# Patient Record
Sex: Female | Born: 1955 | Race: White | Hispanic: No | Marital: Married | Smoking: Never smoker
Health system: Southern US, Community
[De-identification: ages and names within clinical notes are randomized; demographics above are authoritative.]

## PROBLEM LIST (undated history)

## (undated) DIAGNOSIS — N63 Unspecified lump in unspecified breast: Secondary | ICD-10-CM

## (undated) DIAGNOSIS — I341 Nonrheumatic mitral (valve) prolapse: Secondary | ICD-10-CM

## (undated) DIAGNOSIS — M1990 Unspecified osteoarthritis, unspecified site: Secondary | ICD-10-CM

## (undated) DIAGNOSIS — R87619 Unspecified abnormal cytological findings in specimens from cervix uteri: Secondary | ICD-10-CM

## (undated) DIAGNOSIS — T7840XA Allergy, unspecified, initial encounter: Secondary | ICD-10-CM

## (undated) DIAGNOSIS — R011 Cardiac murmur, unspecified: Secondary | ICD-10-CM

## (undated) DIAGNOSIS — E079 Disorder of thyroid, unspecified: Secondary | ICD-10-CM

## (undated) DIAGNOSIS — F419 Anxiety disorder, unspecified: Secondary | ICD-10-CM

## (undated) DIAGNOSIS — H269 Unspecified cataract: Secondary | ICD-10-CM

## (undated) DIAGNOSIS — F32A Depression, unspecified: Secondary | ICD-10-CM

## (undated) DIAGNOSIS — D649 Anemia, unspecified: Secondary | ICD-10-CM

## (undated) DIAGNOSIS — F329 Major depressive disorder, single episode, unspecified: Secondary | ICD-10-CM

## (undated) HISTORY — DX: Nonrheumatic mitral (valve) prolapse: I34.1

## (undated) HISTORY — DX: Depression, unspecified: F32.A

## (undated) HISTORY — PX: COLONOSCOPY: SHX174

## (undated) HISTORY — PX: COLPOSCOPY: SHX161

## (undated) HISTORY — DX: Disorder of thyroid, unspecified: E07.9

## (undated) HISTORY — DX: Anemia, unspecified: D64.9

## (undated) HISTORY — DX: Cardiac murmur, unspecified: R01.1

## (undated) HISTORY — DX: Unspecified osteoarthritis, unspecified site: M19.90

## (undated) HISTORY — DX: Unspecified cataract: H26.9

## (undated) HISTORY — DX: Unspecified abnormal cytological findings in specimens from cervix uteri: R87.619

## (undated) HISTORY — DX: Anxiety disorder, unspecified: F41.9

## (undated) HISTORY — DX: Allergy, unspecified, initial encounter: T78.40XA

## (undated) HISTORY — DX: Major depressive disorder, single episode, unspecified: F32.9

---

## 1997-08-01 ENCOUNTER — Other Ambulatory Visit: Admission: RE | Admit: 1997-08-01 | Discharge: 1997-08-01 | Payer: Self-pay | Admitting: Internal Medicine

## 1998-08-07 ENCOUNTER — Other Ambulatory Visit: Admission: RE | Admit: 1998-08-07 | Discharge: 1998-08-07 | Payer: Self-pay | Admitting: Internal Medicine

## 1998-10-08 ENCOUNTER — Encounter (INDEPENDENT_AMBULATORY_CARE_PROVIDER_SITE_OTHER): Payer: Self-pay | Admitting: Specialist

## 1998-10-08 ENCOUNTER — Other Ambulatory Visit: Admission: RE | Admit: 1998-10-08 | Discharge: 1998-10-08 | Payer: Self-pay | Admitting: Obstetrics and Gynecology

## 1999-04-02 ENCOUNTER — Other Ambulatory Visit: Admission: RE | Admit: 1999-04-02 | Discharge: 1999-04-02 | Payer: Self-pay | Admitting: Obstetrics and Gynecology

## 1999-08-05 ENCOUNTER — Other Ambulatory Visit: Admission: RE | Admit: 1999-08-05 | Discharge: 1999-08-05 | Payer: Self-pay | Admitting: Obstetrics and Gynecology

## 1999-10-30 ENCOUNTER — Ambulatory Visit (HOSPITAL_COMMUNITY): Admission: RE | Admit: 1999-10-30 | Discharge: 1999-10-30 | Payer: Self-pay | Admitting: Obstetrics and Gynecology

## 1999-10-30 ENCOUNTER — Encounter: Payer: Self-pay | Admitting: Obstetrics and Gynecology

## 1999-12-09 ENCOUNTER — Other Ambulatory Visit: Admission: RE | Admit: 1999-12-09 | Discharge: 1999-12-09 | Payer: Self-pay | Admitting: Obstetrics and Gynecology

## 2001-01-13 ENCOUNTER — Other Ambulatory Visit: Admission: RE | Admit: 2001-01-13 | Discharge: 2001-01-13 | Payer: Self-pay | Admitting: Obstetrics and Gynecology

## 2001-10-17 ENCOUNTER — Ambulatory Visit (HOSPITAL_COMMUNITY): Admission: RE | Admit: 2001-10-17 | Discharge: 2001-10-17 | Payer: Self-pay | Admitting: Obstetrics and Gynecology

## 2001-10-17 ENCOUNTER — Encounter: Payer: Self-pay | Admitting: Obstetrics and Gynecology

## 2002-01-31 ENCOUNTER — Other Ambulatory Visit: Admission: RE | Admit: 2002-01-31 | Discharge: 2002-01-31 | Payer: Self-pay | Admitting: Obstetrics and Gynecology

## 2002-12-20 ENCOUNTER — Ambulatory Visit (HOSPITAL_COMMUNITY): Admission: RE | Admit: 2002-12-20 | Discharge: 2002-12-20 | Payer: Self-pay | Admitting: Obstetrics and Gynecology

## 2003-03-12 ENCOUNTER — Other Ambulatory Visit: Admission: RE | Admit: 2003-03-12 | Discharge: 2003-03-12 | Payer: Self-pay | Admitting: Obstetrics and Gynecology

## 2004-02-08 ENCOUNTER — Ambulatory Visit (HOSPITAL_COMMUNITY): Admission: RE | Admit: 2004-02-08 | Discharge: 2004-02-08 | Payer: Self-pay | Admitting: Obstetrics and Gynecology

## 2004-03-12 ENCOUNTER — Other Ambulatory Visit: Admission: RE | Admit: 2004-03-12 | Discharge: 2004-03-12 | Payer: Self-pay | Admitting: Obstetrics and Gynecology

## 2005-02-25 ENCOUNTER — Ambulatory Visit (HOSPITAL_COMMUNITY): Admission: RE | Admit: 2005-02-25 | Discharge: 2005-02-25 | Payer: Self-pay | Admitting: Obstetrics and Gynecology

## 2005-03-13 ENCOUNTER — Other Ambulatory Visit: Admission: RE | Admit: 2005-03-13 | Discharge: 2005-03-13 | Payer: Self-pay | Admitting: Obstetrics & Gynecology

## 2006-04-14 ENCOUNTER — Other Ambulatory Visit: Admission: RE | Admit: 2006-04-14 | Discharge: 2006-04-14 | Payer: Self-pay | Admitting: Obstetrics & Gynecology

## 2006-12-14 ENCOUNTER — Ambulatory Visit (HOSPITAL_COMMUNITY): Admission: RE | Admit: 2006-12-14 | Discharge: 2006-12-14 | Payer: Self-pay | Admitting: Obstetrics and Gynecology

## 2007-04-15 ENCOUNTER — Other Ambulatory Visit: Admission: RE | Admit: 2007-04-15 | Discharge: 2007-04-15 | Payer: Self-pay | Admitting: Obstetrics & Gynecology

## 2008-04-10 ENCOUNTER — Ambulatory Visit (HOSPITAL_COMMUNITY): Admission: RE | Admit: 2008-04-10 | Discharge: 2008-04-10 | Payer: Self-pay | Admitting: Obstetrics and Gynecology

## 2008-04-19 ENCOUNTER — Other Ambulatory Visit: Admission: RE | Admit: 2008-04-19 | Discharge: 2008-04-19 | Payer: Self-pay | Admitting: Obstetrics & Gynecology

## 2008-06-21 ENCOUNTER — Ambulatory Visit: Payer: Self-pay | Admitting: Internal Medicine

## 2008-07-05 ENCOUNTER — Ambulatory Visit: Payer: Self-pay | Admitting: Internal Medicine

## 2009-04-25 ENCOUNTER — Ambulatory Visit (HOSPITAL_COMMUNITY): Admission: RE | Admit: 2009-04-25 | Discharge: 2009-04-25 | Payer: Self-pay | Admitting: Internal Medicine

## 2009-05-17 ENCOUNTER — Telehealth (INDEPENDENT_AMBULATORY_CARE_PROVIDER_SITE_OTHER): Payer: Self-pay | Admitting: *Deleted

## 2009-08-12 DEATH — deceased

## 2010-02-13 NOTE — Progress Notes (Signed)
  Phone Note Other Incoming   Request: Send information Summary of Call: Received request for records from [facility]. 2 page Colonscopy report faxed.

## 2010-05-09 ENCOUNTER — Other Ambulatory Visit: Payer: Self-pay | Admitting: Obstetrics & Gynecology

## 2010-05-09 DIAGNOSIS — Z1231 Encounter for screening mammogram for malignant neoplasm of breast: Secondary | ICD-10-CM

## 2010-05-12 ENCOUNTER — Ambulatory Visit (HOSPITAL_COMMUNITY)
Admission: RE | Admit: 2010-05-12 | Discharge: 2010-05-12 | Disposition: A | Discharge status: Home / Self Care | Payer: BC Managed Care – PPO | Source: Ambulatory Visit | Attending: Obstetrics & Gynecology | Admitting: Obstetrics & Gynecology

## 2010-05-12 DIAGNOSIS — Z1231 Encounter for screening mammogram for malignant neoplasm of breast: Secondary | ICD-10-CM | POA: Insufficient documentation

## 2011-05-05 ENCOUNTER — Other Ambulatory Visit: Payer: Self-pay | Admitting: Obstetrics & Gynecology

## 2011-05-05 DIAGNOSIS — Z1231 Encounter for screening mammogram for malignant neoplasm of breast: Secondary | ICD-10-CM

## 2011-05-28 ENCOUNTER — Ambulatory Visit (HOSPITAL_COMMUNITY)
Admission: RE | Admit: 2011-05-28 | Discharge: 2011-05-28 | Disposition: A | Discharge status: Home / Self Care | Payer: BC Managed Care – PPO | Source: Ambulatory Visit | Attending: Obstetrics & Gynecology | Admitting: Obstetrics & Gynecology

## 2011-05-28 DIAGNOSIS — Z1231 Encounter for screening mammogram for malignant neoplasm of breast: Secondary | ICD-10-CM | POA: Insufficient documentation

## 2012-05-18 ENCOUNTER — Other Ambulatory Visit: Payer: Self-pay | Admitting: Obstetrics and Gynecology

## 2012-05-18 DIAGNOSIS — Z1231 Encounter for screening mammogram for malignant neoplasm of breast: Secondary | ICD-10-CM

## 2012-05-20 ENCOUNTER — Encounter: Payer: Self-pay | Admitting: Certified Nurse Midwife

## 2012-05-23 ENCOUNTER — Ambulatory Visit: Payer: Self-pay | Admitting: Certified Nurse Midwife

## 2012-05-31 ENCOUNTER — Ambulatory Visit (HOSPITAL_COMMUNITY)
Admission: RE | Admit: 2012-05-31 | Discharge: 2012-05-31 | Disposition: A | Discharge status: Home / Self Care | Payer: BC Managed Care – PPO | Source: Ambulatory Visit | Attending: Obstetrics and Gynecology | Admitting: Obstetrics and Gynecology

## 2012-05-31 DIAGNOSIS — Z1231 Encounter for screening mammogram for malignant neoplasm of breast: Secondary | ICD-10-CM | POA: Insufficient documentation

## 2012-06-02 ENCOUNTER — Encounter: Payer: Self-pay | Admitting: Certified Nurse Midwife

## 2012-06-02 ENCOUNTER — Ambulatory Visit (INDEPENDENT_AMBULATORY_CARE_PROVIDER_SITE_OTHER): Payer: BC Managed Care – PPO | Admitting: Certified Nurse Midwife

## 2012-06-02 VITALS — BP 104/64 | Ht 62.5 in | Wt 132.0 lb

## 2012-06-02 DIAGNOSIS — E039 Hypothyroidism, unspecified: Secondary | ICD-10-CM

## 2012-06-02 DIAGNOSIS — N951 Menopausal and female climacteric states: Secondary | ICD-10-CM

## 2012-06-02 DIAGNOSIS — Z01419 Encounter for gynecological examination (general) (routine) without abnormal findings: Secondary | ICD-10-CM

## 2012-06-02 DIAGNOSIS — Z0000 Encounter for general adult medical examination without abnormal findings: Secondary | ICD-10-CM

## 2012-06-02 LAB — POCT URINALYSIS DIPSTICK
Blood, UA: NEGATIVE
Ketones, UA: NEGATIVE
Urobilinogen, UA: NEGATIVE

## 2012-06-02 LAB — TSH: TSH: 1.663 u[IU]/mL (ref 0.350–4.500)

## 2012-06-02 NOTE — Progress Notes (Signed)
57 y.o. G28P2003 Married Caucasian Fe here for annual exam. Menopausal now, no period in one year.  Denies vaginal bleeding after stopping OCP in 5-13. Occasional hot flashes, no night sweats, desires no HRT.  Saw PCP in fall with labs, all normal. Had mammogram 2 days ago, no results yet.  No health issues today.  Denies signs or symptoms of change in thyroid.  Patient's last menstrual period was 05/18/2011.          Sexually active: yes  The current method of family planning is none.    Exercising: yes  bike & walking Smoker:  no  Health Maintenance: Pap:  05-18-11 neg HPV HR neg MMG:  05/31/12 Colonoscopy:  2010 BMD:   none TDaP:  2007 Labs: Poct urine-neg Self breast exams: occ   reports that she has never smoked. She does not have any smokeless tobacco history on file. She reports that she drinks about 3.0 ounces of alcohol per week. She reports that she does not use illicit drugs.  Past Medical History  Diagnosis Date  . MVP (mitral valve prolapse)   . Thyroid disease     hypothyroidism  . Depression     Past Surgical History  Procedure Laterality Date  . Colposcopy      [name]  . Cesarean section      Current Outpatient Prescriptions  Medication Sig Dispense Refill  . Calcium Carbonate Antacid (TUMS PO) Take by mouth daily.      . fluvoxaMINE (LUVOX) 100 MG tablet Take 100 mg by mouth. Take 1 1/2 daily      . levothyroxine (SYNTHROID, LEVOTHROID) 50 MCG tablet Take 50 mcg by mouth daily before breakfast.       No current facility-administered medications for this visit.    Family History  Problem Relation Age of Onset  . Hypertension Mother   . Breast cancer Mother   . Diabetes Father     ROS:  Pertinent items are noted in HPI.  Otherwise, a comprehensive ROS was negative.  Exam:   BP 104/64  Ht 5' 2.5" (1.588 m)  Wt 132 lb (59.875 kg)  BMI 23.74 kg/m2  LMP 05/18/2011 Height: 5' 2.5" (158.8 cm)  Ht Readings from Last 3 Encounters:  06/02/12 5' 2.5" (1.588  m)    General appearance: alert, cooperative and appears stated age Head: Normocephalic, without obvious abnormality, atraumatic Neck: no adenopathy, supple, symmetrical, trachea midline and thyroid normal to inspection and palpation Lungs: clear to auscultation bilaterally Breasts: normal appearance, no masses or tenderness, No nipple retraction or dimpling, No nipple discharge or bleeding, No axillary or supraclavicular adenopathy Heart: regular rate and rhythm Abdomen: soft, non-tender; no masses,  no organomegaly Extremities: extremities normal, atraumatic, no cyanosis or edema Skin: Skin color, texture, turgor normal. No rashes or lesions Lymph nodes: Cervical, supraclavicular, and axillary nodes normal. No abnormal inguinal nodes palpated Neurologic: Grossly normal   Pelvic: External genitalia:  no lesions              Urethra:  normal appearing urethra with no masses, tenderness or lesions              Bartholin's and Skene's: normal                 Vagina: normal appearing vagina with normal color and discharge, no lesions              Cervix: normal, non tender  Pap taken: no Bimanual Exam:  Uterus:  normal size, contour, position, consistency, mobility, non-tender and anteflexed              Adnexa: normal adnexa and no mass, fullness, tenderness               Rectovaginal: Confirms               Anus:  normal sphincter tone, no lesions  A:  Well Woman with normal exam  Menopausal, no menses in a year  Hypothyroid stable medication  P:   Health and wellness reviewed pertinent to exam  Discussed importance of evaluation if vaginal bleeding occurs now that menopausal  Has Rx will refill once TSH results in  Lab:TSH,FSH  Pap smear as per guidelines   Mammogram yearly pap smear not taken today counseled on breast self exam, menopause, adequate intake of calcium and vitamin D, diet and exercise  return annually or prn  An After Visit Summary was printed and  given to the patient.  Reviewed, TL

## 2012-06-24 ENCOUNTER — Telehealth: Payer: Self-pay

## 2012-06-24 ENCOUNTER — Other Ambulatory Visit: Payer: Self-pay | Admitting: Certified Nurse Midwife

## 2012-06-24 NOTE — Telephone Encounter (Signed)
Message copied by Eliezer Bottom on Fri Jun 24, 2012  2:00 PM ------      Message from: Verner Chol      Created: Fri Jun 24, 2012  1:14 PM       Notify thyroid stable with current dose of Synthroid.  Refill authorized.      FSH showing low menopausal range, needs to notify if any vaginal bleeding      Has she scheduled PUS yet waiting on result to determine if Provera needs to be given ------

## 2012-06-24 NOTE — Telephone Encounter (Signed)
Pharmacy is requesting refill. At aex it says you were waiting on labs. Labs came in but it doesn't look like you saw them or addressed them. Please advise & refill med is appropriate

## 2012-06-24 NOTE — Telephone Encounter (Signed)
Patient notified by jasmine

## 2012-06-24 NOTE — Telephone Encounter (Signed)
rx approved by DL

## 2012-06-24 NOTE — Progress Notes (Signed)
Routed to DL 

## 2012-06-24 NOTE — Telephone Encounter (Signed)
Left message for callback to give lab results & let pt know med was sent to pharmacy

## 2012-06-27 ENCOUNTER — Telehealth: Payer: Self-pay

## 2012-06-27 NOTE — Telephone Encounter (Signed)
Left message for call back.

## 2012-06-27 NOTE — Telephone Encounter (Signed)
lmtcb

## 2012-06-27 NOTE — Telephone Encounter (Signed)
Patient returned your call.

## 2012-06-27 NOTE — Telephone Encounter (Signed)
Message copied by Eliezer Bottom on Mon Jun 27, 2012 10:19 AM ------      Message from: Verner Chol      Created: Mon Jun 27, 2012  7:48 AM       PUS note was entered in error.      Patient should notify if any vaginal bleeding now that she is menopausal. ------

## 2012-06-28 NOTE — Telephone Encounter (Signed)
Patient notified

## 2012-11-17 ENCOUNTER — Other Ambulatory Visit: Payer: Self-pay

## 2012-12-17 ENCOUNTER — Other Ambulatory Visit: Payer: Self-pay | Admitting: Certified Nurse Midwife

## 2012-12-19 NOTE — Telephone Encounter (Signed)
Per AEX note and lab results ok to stay on this dose. Has AEX scheduled for 5/15//kn

## 2013-02-01 ENCOUNTER — Encounter: Payer: Self-pay | Admitting: Certified Nurse Midwife

## 2013-06-01 ENCOUNTER — Other Ambulatory Visit (HOSPITAL_COMMUNITY): Payer: Self-pay | Admitting: Obstetrics and Gynecology

## 2013-06-01 ENCOUNTER — Other Ambulatory Visit: Payer: Self-pay | Admitting: Certified Nurse Midwife

## 2013-06-01 DIAGNOSIS — Z1231 Encounter for screening mammogram for malignant neoplasm of breast: Secondary | ICD-10-CM

## 2013-06-06 ENCOUNTER — Ambulatory Visit: Payer: BC Managed Care – PPO | Admitting: Certified Nurse Midwife

## 2013-06-12 ENCOUNTER — Ambulatory Visit (HOSPITAL_COMMUNITY)
Admission: RE | Admit: 2013-06-12 | Discharge: 2013-06-12 | Disposition: A | Discharge status: Home / Self Care | Payer: BC Managed Care – PPO | Source: Ambulatory Visit | Attending: Obstetrics and Gynecology | Admitting: Obstetrics and Gynecology

## 2013-06-12 DIAGNOSIS — Z1231 Encounter for screening mammogram for malignant neoplasm of breast: Secondary | ICD-10-CM | POA: Insufficient documentation

## 2013-06-20 ENCOUNTER — Other Ambulatory Visit: Payer: Self-pay | Admitting: Certified Nurse Midwife

## 2013-06-20 NOTE — Telephone Encounter (Signed)
Last AEX and TSH check 06/02/12 Last refill 12/17/12 #30/5 refills Next appt 06/23/13  Rx sent for 1 month until appt.

## 2013-06-23 ENCOUNTER — Ambulatory Visit (INDEPENDENT_AMBULATORY_CARE_PROVIDER_SITE_OTHER): Payer: BC Managed Care – PPO | Admitting: Certified Nurse Midwife

## 2013-06-23 ENCOUNTER — Encounter: Payer: Self-pay | Admitting: Certified Nurse Midwife

## 2013-06-23 VITALS — BP 110/72 | HR 68 | Resp 16 | Ht 62.25 in | Wt 126.0 lb

## 2013-06-23 DIAGNOSIS — Z0000 Encounter for general adult medical examination without abnormal findings: Secondary | ICD-10-CM

## 2013-06-23 DIAGNOSIS — Z01419 Encounter for gynecological examination (general) (routine) without abnormal findings: Secondary | ICD-10-CM

## 2013-06-23 DIAGNOSIS — Z124 Encounter for screening for malignant neoplasm of cervix: Secondary | ICD-10-CM

## 2013-06-23 DIAGNOSIS — E039 Hypothyroidism, unspecified: Secondary | ICD-10-CM

## 2013-06-23 LAB — POCT URINALYSIS DIPSTICK
Bilirubin, UA: NEGATIVE
Blood, UA: NEGATIVE
Glucose, UA: NEGATIVE
Ketones, UA: NEGATIVE
Leukocytes, UA: NEGATIVE
Nitrite, UA: NEGATIVE
Protein, UA: NEGATIVE
Urobilinogen, UA: NEGATIVE
pH, UA: 5

## 2013-06-23 LAB — TSH: TSH: 1.239 u[IU]/mL (ref 0.350–4.500)

## 2013-06-23 NOTE — Progress Notes (Signed)
58 y.o. G2P2003 Married Caucasian Fe here for annual exam. Menopausal no vaginal bleeding or vaginal dryness. No period or bleeding since last aex exam and Provera use. "Good year" daughter getting married in 5/16 !  Sees PCP yearly with labs. Synthroid working well no issues. No health issues today.  Patient's last menstrual period was 06/12/2012.          Sexually active: yes  The current method of family planning is none.    Exercising: yes  bike & walk Smoker:  no  Health Maintenance: Pap: 05-18-11 neg HPV HR neg MMG: 06-12-13 normal Colonoscopy: 2010 polyp removed, due 2015, patient aware and is scheduling BMD:   none TDaP:  2007 Labs: Poct urine-neg Self breast exam: done occ   reports that she has never smoked. She does not have any smokeless tobacco history on file. She reports that she drinks about 3 - 4 ounces of alcohol per week. She reports that she does not use illicit drugs.  Past Medical History  Diagnosis Date  . MVP (mitral valve prolapse)   . Thyroid disease     hypothyroidism  . Depression     Past Surgical History  Procedure Laterality Date  . Colposcopy      [name]  . Cesarean section      Current Outpatient Prescriptions  Medication Sig Dispense Refill  . Calcium Carbonate Antacid (TUMS PO) Take by mouth daily.      . fluvoxaMINE (LUVOX) 100 MG tablet Take 100 mg by mouth. Take 1 1/2 daily      . SYNTHROID 50 MCG tablet take 1 tablet by mouth once daily  30 tablet  0   No current facility-administered medications for this visit.    Family History  Problem Relation Age of Onset  . Hypertension Mother   . Breast cancer Mother   . Diabetes Father     ROS:  Pertinent items are noted in HPI.  Otherwise, a comprehensive ROS was negative.  Exam:   BP 110/72  Pulse 68  Resp 16  Ht 5' 2.25" (1.581 m)  Wt 126 lb (57.153 kg)  BMI 22.87 kg/m2  LMP 06/12/2012 Height: 5' 2.25" (158.1 cm)  Ht Readings from Last 3 Encounters:  06/23/13 5' 2.25" (1.581 m)   06/02/12 5' 2.5" (1.588 m)    General appearance: alert, cooperative and appears stated age Head: Normocephalic, without obvious abnormality, atraumatic Neck: no adenopathy, supple, symmetrical, trachea midline and thyroid normal to inspection and palpation and non-palpable Lungs: clear to auscultation bilaterally Breasts: normal appearance, no masses or tenderness, No nipple retraction or dimpling, No nipple discharge or bleeding, No axillary or supraclavicular adenopathy Heart: regular rate and rhythm Abdomen: soft, non-tender; no masses,  no organomegaly Extremities: extremities normal, atraumatic, no cyanosis or edema Skin: Skin color, texture, turgor normal. No rashes or lesions Lymph nodes: Cervical, supraclavicular, and axillary nodes normal. No abnormal inguinal nodes palpated Neurologic: Grossly normal   Pelvic: External genitalia:  no lesions              Urethra:  normal appearing urethra with no masses, tenderness or lesions              Bartholin's and Skene's: normal                 Vagina: normal appearing vagina with normal color and discharge, no lesions              Cervix: normal, non tender  Pap taken: yes Bimanual Exam:  Uterus:  normal size, contour, position, consistency, mobility, non-tender and anteverted              Adnexa: normal adnexa and no mass, fullness, tenderness               Rectovaginal: Confirms               Anus:  normal sphincter tone, no lesions  A:  Well Woman with normal exam  Menopausal no HRT  Hypothyroid medication stable  P:   Reviewed health and wellness pertinent to exam  Aware of need to advise if vaginal bleeding  Lab:TSH will refill Rx once level in, patient has 30 days left.  Pap smear taken today with HPV reflex  counseled on breast self exam, mammography screening, menopause, adequate intake of calcium and vitamin D, diet and exercise  return annually or prn  An After Visit Summary was printed and given to  the patient.

## 2013-06-23 NOTE — Patient Instructions (Signed)

## 2013-06-24 NOTE — Progress Notes (Signed)
Reviewed personally.  M. Suzanne Islam Villescas, MD.  

## 2013-06-26 MED ORDER — LEVOTHYROXINE SODIUM 50 MCG PO TABS: 50.0000 ug | ORAL_TABLET | Freq: Every day | ORAL | Status: DC

## 2013-06-26 NOTE — Addendum Note (Signed)
Addended by: Regina Eck on: 06/26/2013 08:23 AM   Modules accepted: Orders

## 2013-06-27 LAB — IPS PAP TEST WITH REFLEX TO HPV

## 2013-11-13 ENCOUNTER — Encounter: Payer: Self-pay | Admitting: Certified Nurse Midwife

## 2013-12-21 ENCOUNTER — Encounter: Payer: Self-pay | Admitting: Internal Medicine

## 2014-02-20 ENCOUNTER — Ambulatory Visit (AMBULATORY_SURGERY_CENTER): Payer: Self-pay

## 2014-02-20 VITALS — Ht 63.0 in | Wt 124.0 lb

## 2014-02-20 DIAGNOSIS — Z860100 Personal history of colon polyps, unspecified: Secondary | ICD-10-CM

## 2014-02-20 MED ORDER — MOVIPREP 100 G PO SOLR: 1.0000 | Freq: Once | ORAL | Status: DC

## 2014-02-20 NOTE — Progress Notes (Signed)
No allergies to eggs or soy No home oxygen No diet/weight loss meds No past problems with anesthesia  Has email  Emmi instructions given for colonoscopy 

## 2014-03-06 ENCOUNTER — Encounter: Payer: Self-pay | Admitting: Internal Medicine

## 2014-03-06 ENCOUNTER — Ambulatory Visit (AMBULATORY_SURGERY_CENTER): Payer: BLUE CROSS/BLUE SHIELD | Admitting: Internal Medicine

## 2014-03-06 VITALS — BP 122/77 | HR 79 | Temp 98.6°F | Resp 27 | Ht 63.0 in | Wt 124.0 lb

## 2014-03-06 DIAGNOSIS — D122 Benign neoplasm of ascending colon: Secondary | ICD-10-CM

## 2014-03-06 DIAGNOSIS — Z86010 Personal history of colonic polyps: Secondary | ICD-10-CM

## 2014-03-06 MED ORDER — SODIUM CHLORIDE 0.9 % IV SOLN: 500.0000 mL | INTRAVENOUS | Status: DC

## 2014-03-06 NOTE — Patient Instructions (Signed)
YOU HAD AN ENDOSCOPIC PROCEDURE TODAY AT THE [facility]: Refer to the procedure report that was given to you for any specific questions about what was found during the examination.  If the procedure report does not answer your questions, please call your gastroenterologist to clarify.  If you requested that your care partner not be given the details of your procedure findings, then the procedure report has been included in a sealed envelope for you to review at your convenience later.  YOU SHOULD EXPECT: Some feelings of bloating in the abdomen. Passage of more gas than usual.  Walking can help get rid of the air that was put into your GI tract during the procedure and reduce the bloating. If you had a lower endoscopy (such as a colonoscopy or flexible sigmoidoscopy) you may notice spotting of blood in your stool or on the toilet paper. If you underwent a bowel prep for your procedure, then you may not have a normal bowel movement for a few days.  DIET: Your first meal following the procedure should be a light meal and then it is ok to progress to your normal diet.  A half-sandwich or bowl of soup is an example of a good first meal.  Heavy or fried foods are harder to digest and may make you feel nauseous or bloated.  Likewise meals heavy in dairy and vegetables can cause extra gas to form and this can also increase the bloating.  Drink plenty of fluids but you should avoid alcoholic beverages for 24 hours.  ACTIVITY: Your care partner should take you home directly after the procedure.  You should plan to take it easy, moving slowly for the rest of the day.  You can resume normal activity the day after the procedure however you should NOT DRIVE or use heavy machinery for 24 hours (because of the sedation medicines used during the test).    SYMPTOMS TO REPORT IMMEDIATELY: A gastroenterologist can be reached at any hour.  During normal business hours, 8:30 AM to 5:00 PM Monday through Friday,  call [phone].  After hours and on weekends, please call the GI answering service at [phone] who will take a message and have the physician on call contact you.   Following lower endoscopy (colonoscopy or flexible sigmoidoscopy):  Excessive amounts of blood in the stool  Significant tenderness or worsening of abdominal pains  Swelling of the abdomen that is new, acute  Fever of 100F or higher  FOLLOW UP: If any biopsies were taken you will be contacted by phone or by letter within the next 1-3 weeks.  Call your gastroenterologist if you have not heard about the biopsies in 3 weeks.  Our staff will call the home number listed on your records the next business day following your procedure to check on you and address any questions or concerns that you may have at that time regarding the information given to you following your procedure. This is a courtesy call and so if there is no answer at the home number and we have not heard from you through the emergency physician on call, we will assume that you have returned to your regular daily activities without incident.  SIGNATURES/CONFIDENTIALITY: You and/or your care partner have signed paperwork which will be entered into your electronic medical record.  These signatures attest to the fact that that the information above on your After Visit Summary has been reviewed and is understood.  Full responsibility of the confidentiality of this   discharge information lies with you and/or your care-partner.  Continue your normal medications  Please read over handouts about polyps and high fiber diets  You might note some irritation in your nose or some drainage.  This may cause feelings of congestion.  This is from the oxygen, which can be irritating.  There is no need for concern, this should clear up in a day or so.

## 2014-03-06 NOTE — Op Note (Signed)
[facility]  [address] [place], [address]   COLONOSCOPY PROCEDURE REPORT  PATIENT: Nicole Singleton, Nicole Singleton  MR#: [id number] BIRTHDATE: 1955/05/10 , 68  yrs. old GENDER: female ENDOSCOPIST: Lafayette Dragon, MD REFERRED BY:R.  Marcellus Scott, M.D., Dr Edwinna Areola PROCEDURE DATE:  03/06/2014 PROCEDURE:   Colonoscopy with snare polypectomy First Screening Colonoscopy - Avg.  risk and is 50 yrs.  old or older - No.  Prior Negative Screening - Now for repeat screening. N/A  History of Adenoma - Now for follow-up colonoscopy & has been > or = to 3 yrs.  N/A  Polyps Removed Today? Yes. ASA CLASS:   Class I INDICATIONS:prior colonoscopy in June 2010.  Pedunculated sigmoid polyp removed but not retrieved. MEDICATIONS: Monitored anesthesia care and Propofol 200 mg IV  DESCRIPTION OF PROCEDURE:   After the risks benefits and alternatives of the procedure were thoroughly explained, informed consent was obtained.  The digital rectal exam revealed no abnormalities of the rectum.   The LB [id number]  endoscope was introduced through the anus and advanced to the cecum, which was identified by both the appendix and ileocecal valve. No adverse events experienced.   The quality of the prep was excellent, using MoviPrep  The instrument was then slowly withdrawn as the colon was fully examined.      COLON FINDINGS: A polypoid shaped sessile polyp measuring 7 mm in size was found in the ascending colon.  A polypectomy was performed with a cold snare.  The resection was complete, the polyp tissue was completely retrieved and sent to histology.  Retroflexed views revealed no abnormalities. The time to cecum=2 minutes 38 seconds. Withdrawal time=6 minutes 00 seconds.  The scope was withdrawn and the procedure completed. COMPLICATIONS: There were no immediate complications.  ENDOSCOPIC IMPRESSION: Sessile polyp was found in the ascending colon; polypectomy was performed with a  cold snare  RECOMMENDATIONS: 1.  Await pathology results 2.  High-fiber diet Recall colonoscopy pending path report  eSigned:  Lafayette Dragon, MD 03/06/2014 10:56 AM   cc:

## 2014-03-06 NOTE — Progress Notes (Signed)
Called to room to assist during endoscopic procedure.  Patient ID and intended procedure confirmed with present staff. Received instructions for my participation in the procedure from the performing physician.  

## 2014-03-06 NOTE — Progress Notes (Signed)
Pt stable to RR 

## 2014-03-07 ENCOUNTER — Telehealth: Payer: Self-pay

## 2014-03-07 NOTE — Telephone Encounter (Signed)
  Follow up Call-  Call back number 03/06/2014  Post procedure Call Back phone  # [phone]  Permission to leave phone message Yes     Patient questions:  Do you have a fever, pain , or abdominal swelling? No. Pain Score  0 *  Have you tolerated food without any problems? Yes.    Have you been able to return to your normal activities? Yes.    Do you have any questions about your discharge instructions: Diet   No. Medications  No. Follow up visit  No.  Do you have questions or concerns about your Care? No.  Actions: * If pain score is 4 or above: No action needed, pain <4.

## 2014-03-13 ENCOUNTER — Encounter: Payer: Self-pay | Admitting: Internal Medicine

## 2014-05-24 ENCOUNTER — Other Ambulatory Visit: Payer: Self-pay | Admitting: Certified Nurse Midwife

## 2014-05-24 DIAGNOSIS — Z1231 Encounter for screening mammogram for malignant neoplasm of breast: Secondary | ICD-10-CM

## 2014-06-14 ENCOUNTER — Other Ambulatory Visit: Payer: Self-pay | Admitting: Certified Nurse Midwife

## 2014-06-14 ENCOUNTER — Ambulatory Visit (HOSPITAL_COMMUNITY)
Admission: RE | Admit: 2014-06-14 | Discharge: 2014-06-14 | Disposition: A | Discharge status: Home / Self Care | Payer: BLUE CROSS/BLUE SHIELD | Source: Ambulatory Visit | Attending: Certified Nurse Midwife | Admitting: Certified Nurse Midwife

## 2014-06-14 DIAGNOSIS — Z1231 Encounter for screening mammogram for malignant neoplasm of breast: Secondary | ICD-10-CM | POA: Diagnosis not present

## 2014-06-28 ENCOUNTER — Ambulatory Visit (INDEPENDENT_AMBULATORY_CARE_PROVIDER_SITE_OTHER): Payer: BLUE CROSS/BLUE SHIELD | Admitting: Certified Nurse Midwife

## 2014-06-28 ENCOUNTER — Encounter: Payer: Self-pay | Admitting: Certified Nurse Midwife

## 2014-06-28 VITALS — BP 110/78 | HR 80 | Resp 14 | Ht 62.0 in | Wt 125.0 lb

## 2014-06-28 DIAGNOSIS — E039 Hypothyroidism, unspecified: Secondary | ICD-10-CM

## 2014-06-28 DIAGNOSIS — Z01419 Encounter for gynecological examination (general) (routine) without abnormal findings: Secondary | ICD-10-CM

## 2014-06-28 DIAGNOSIS — Z0000 Encounter for general adult medical examination without abnormal findings: Secondary | ICD-10-CM | POA: Diagnosis not present

## 2014-06-28 LAB — POCT URINALYSIS DIPSTICK
Bilirubin, UA: NEGATIVE
Glucose, UA: NEGATIVE
Ketones, UA: NEGATIVE
Leukocytes, UA: NEGATIVE
Nitrite, UA: NEGATIVE
PROTEIN UA: NEGATIVE
RBC UA: NEGATIVE
SPEC GRAV UA: 1.015
Urobilinogen, UA: NEGATIVE
pH, UA: 6.5

## 2014-06-28 LAB — HEMOGLOBIN, FINGERSTICK: Hemoglobin, fingerstick: 12.9 g/dL (ref 12.0–16.0)

## 2014-06-28 LAB — TSH: TSH: 1.095 u[IU]/mL (ref 0.350–4.500)

## 2014-06-28 NOTE — Progress Notes (Signed)
Patient ID: Nicole Singleton, female   DOB: 13-Mar-1955, 59 y.o.   MRN: [id number] 59 y.o. [id number] MarriedCaucasianF here for annual exam. Menopausal no HRT. Denies vaginal bleeding or vaginal dryness. Thyroid medication working well, no issues. Sees PCP for labs/ aex every two years. All normal in 2015. Good year, no issues today.  Patient's last menstrual period was 06/12/2012.          Sexually active: Yes.    The current method of family planning is post menopausal status.    Exercising: Yes.    bike Smoker:  no  Health Maintenance: Pap:  06-23-13 WNL History of abnormal Pap:  Yes many years ago- cryotherapy  MMG:  06-14-14 WNL Colonoscopy:  03-06-14 polyps repeat in 5 years BMD:   Never TDaP:  12-13-2005 Screening Labs: drawn today  Hb today: 12.9, Urine today: WNL    reports that she has never smoked. She has never used smokeless tobacco. She reports that she drinks about 3.6 - 4.8 oz of alcohol per week. She reports that she does not use illicit drugs.  Past Medical History  Diagnosis Date  . MVP (mitral valve prolapse)   . Thyroid disease     hypothyroidism  . Depression     Past Surgical History  Procedure Laterality Date  . Colposcopy      [name]  . Cesarean section      Current Outpatient Prescriptions  Medication Sig Dispense Refill  . Biotin 5000 MCG CAPS Take by mouth.    . Calcium Carbonate Antacid (TUMS PO) Take by mouth daily.    . fluvoxaMINE (LUVOX) 50 MG tablet Take 150 mg by mouth at bedtime.     Marland Kitchen levothyroxine (SYNTHROID, LEVOTHROID) 50 MCG tablet Take 1 tablet (50 mcg total) by mouth daily. 30 tablet 12  . Multiple Vitamin (MULTIVITAMIN) tablet Take 1 tablet by mouth daily.     No current facility-administered medications for this visit.    Family History  Problem Relation Age of Onset  . Hypertension Mother   . Breast cancer Mother   . Diabetes Father   . Colon cancer Neg Hx   . Stomach cancer Neg Hx     ROS:  Pertinent items are noted in HPI.   Otherwise, a comprehensive ROS was negative.  Exam:   BP 110/78 mmHg  Pulse 80  Resp 14  Ht 5\' 2"  (1.575 m)  Wt 125 lb (56.7 kg)  BMI 22.86 kg/m2  LMP 06/12/2012  Weight change: @WEIGHTCHANGE @ Height:   Height: 5\' 2"  (157.5 cm)  Ht Readings from Last 3 Encounters:  06/28/14 5\' 2"  (1.575 m)  03/06/14 5\' 3"  (1.6 m)  02/20/14 5\' 3"  (1.6 m)    General appearance: alert, cooperative and appears stated age Head: Normocephalic, without obvious abnormality, atraumatic Neck: no adenopathy, supple, symmetrical, trachea midline and thyroid normal to inspection and palpation Lungs: clear to auscultation bilaterally Breasts: normal appearance, no masses or tenderness, No nipple retraction or dimpling, No nipple discharge or bleeding, No axillary or supraclavicular adenopathy Heart: regular rate and rhythm Abdomen: soft, non-tender; bowel sounds normal; no masses,  no organomegaly Extremities: extremities normal, atraumatic, no cyanosis or edema Skin: Skin color, texture, turgor normal. No rashes or lesions Lymph nodes: Cervical, supraclavicular, and axillary nodes normal. No abnormal inguinal nodes palpated Neurologic: Grossly normal   Pelvic: External genitalia:  no lesions              Urethra:  normal appearing urethra with no masses, tenderness  or lesions              Bartholins and Skenes: normal                 Vagina: normal appearing vagina with normal color and discharge, no lesions              Cervix: normal, non tender, no lesions              Pap taken: No. Bimanual Exam:  Uterus:  normal size, contour, position, consistency, mobility, non-tender and anteverted              Adnexa: normal adnexa and no mass, fullness, tenderness               Rectovaginal: Confirms               Anus:  normal sphincter tone, no lesions  Chaperone was present for exam.  A:  Well Woman with normal exam  Menopausal no HRT  Hypothyroid with stable medication TSH today  P:   Reviewed exam  and pertinent information regarding exam.   Aware of need to advise if vaginal bleeding.  Patient has enough Synthroid until labs are in, will need refill if stable.  counseled on breast self exam, mammography screening, adequate intake of calcium and vitamin D, diet and exercise return annually or prn

## 2014-06-28 NOTE — Patient Instructions (Signed)

## 2014-06-29 LAB — VITAMIN D 25 HYDROXY (VIT D DEFICIENCY, FRACTURES): Vit D, 25-Hydroxy: 29 ng/mL — ABNORMAL LOW (ref 30–100)

## 2014-06-29 NOTE — Progress Notes (Signed)
Reviewed personally.  M. Suzanne Kashus Karlen, MD.  

## 2014-07-07 ENCOUNTER — Other Ambulatory Visit: Payer: Self-pay | Admitting: Certified Nurse Midwife

## 2014-07-09 NOTE — Telephone Encounter (Signed)
Medication refill request: Synthroid  Last AEX:  06-28-14 Next AEX: 07-03-15 Last MMG (if hormonal medication request): 06-14-14 WNL  Refill authorized: please advise

## 2014-11-27 ENCOUNTER — Encounter: Payer: Self-pay | Admitting: Internal Medicine

## 2015-06-25 ENCOUNTER — Other Ambulatory Visit: Payer: Self-pay | Admitting: Certified Nurse Midwife

## 2015-06-25 DIAGNOSIS — Z1231 Encounter for screening mammogram for malignant neoplasm of breast: Secondary | ICD-10-CM

## 2015-07-02 ENCOUNTER — Ambulatory Visit
Admission: RE | Admit: 2015-07-02 | Discharge: 2015-07-02 | Disposition: A | Discharge status: Home / Self Care | Payer: BLUE CROSS/BLUE SHIELD | Source: Ambulatory Visit | Attending: Certified Nurse Midwife | Admitting: Certified Nurse Midwife

## 2015-07-02 DIAGNOSIS — Z1231 Encounter for screening mammogram for malignant neoplasm of breast: Secondary | ICD-10-CM

## 2015-07-03 ENCOUNTER — Encounter: Payer: Self-pay | Admitting: Certified Nurse Midwife

## 2015-07-03 ENCOUNTER — Ambulatory Visit (INDEPENDENT_AMBULATORY_CARE_PROVIDER_SITE_OTHER): Payer: BLUE CROSS/BLUE SHIELD | Admitting: Certified Nurse Midwife

## 2015-07-03 VITALS — BP 120/72 | HR 72 | Resp 16 | Ht 62.25 in | Wt 126.0 lb

## 2015-07-03 DIAGNOSIS — Z01419 Encounter for gynecological examination (general) (routine) without abnormal findings: Secondary | ICD-10-CM | POA: Diagnosis not present

## 2015-07-03 DIAGNOSIS — Z124 Encounter for screening for malignant neoplasm of cervix: Secondary | ICD-10-CM | POA: Diagnosis not present

## 2015-07-03 DIAGNOSIS — Z0000 Encounter for general adult medical examination without abnormal findings: Secondary | ICD-10-CM

## 2015-07-03 DIAGNOSIS — Z1151 Encounter for screening for human papillomavirus (HPV): Secondary | ICD-10-CM | POA: Diagnosis not present

## 2015-07-03 DIAGNOSIS — N898 Other specified noninflammatory disorders of vagina: Secondary | ICD-10-CM

## 2015-07-03 LAB — POCT URINALYSIS DIPSTICK
BILIRUBIN UA: NEGATIVE
Blood, UA: NEGATIVE
Glucose, UA: NEGATIVE
KETONES UA: NEGATIVE
LEUKOCYTES UA: NEGATIVE
Nitrite, UA: NEGATIVE
PH UA: 5
Protein, UA: NEGATIVE
UROBILINOGEN UA: NEGATIVE

## 2015-07-03 NOTE — Progress Notes (Signed)
Reviewed personally.  M. Suzanne Adonai Selsor, MD.  

## 2015-07-03 NOTE — Patient Instructions (Signed)

## 2015-07-03 NOTE — Progress Notes (Addendum)
60 y.o. G62P2003 Married  Caucasian Fe here for annual exam. Menopausal no HRT. Denies vaginal bleeding or vaginal dryness. Saw PCP Reginia Forts MD for aex and labs. Did not renew thyroid medication will request copy of TSH for renewal. Patient has one month supply. No health issues today. New grandson!  Patient's last menstrual period was 06/12/2012.          Sexually active: Yes.    The current method of family planning is post menopausal status.    Exercising: Yes.    bike, housework, walk Smoker:  no  Health Maintenance: Pap: 06-23-13 neg MMG:  07-02-15 with 3 D Colonoscopy: 2016 polyps f/u 35yrs BMD:   none TDaP:  2017 Shingles: no Pneumonia: no Hep C and HIV: not done Labs: poct urine-neg Self breast exam: done occ   reports that she has never smoked. She has never used smokeless tobacco. She reports that she drinks about 3.6 oz of alcohol per week. She reports that she does not use illicit drugs.  Past Medical History  Diagnosis Date  . MVP (mitral valve prolapse)   . Thyroid disease     hypothyroidism  . Depression     Past Surgical History  Procedure Laterality Date  . Colposcopy      [name]  . Cesarean section      Current Outpatient Prescriptions  Medication Sig Dispense Refill  . Cholecalciferol (VITAMIN D PO) Take 4,000 Int'l Units by mouth.    . fluvoxaMINE (LUVOX) 50 MG tablet Take 150 mg by mouth at bedtime.     . Multiple Vitamin (MULTIVITAMIN) tablet Take 1 tablet by mouth daily.    Marland Kitchen SYNTHROID 50 MCG tablet take 1 tablet by mouth once daily 30 tablet 12   No current facility-administered medications for this visit.    Family History  Problem Relation Age of Onset  . Hypertension Mother   . Breast cancer Mother   . Diabetes Father   . Colon cancer Neg Hx   . Stomach cancer Neg Hx     ROS:  Pertinent items are noted in HPI.  Otherwise, a comprehensive ROS was negative.  Exam:   BP 120/72 mmHg  Pulse 72  Resp 16  Ht 5' 2.25" (1.581 m)  Wt 126 lb  (57.153 kg)  BMI 22.87 kg/m2  LMP 06/12/2012 Height: 5' 2.25" (158.1 cm) Ht Readings from Last 3 Encounters:  07/03/15 5' 2.25" (1.581 m)  06/28/14 5\' 2"  (1.575 m)  03/06/14 5\' 3"  (1.6 m)    General appearance: alert, cooperative and appears stated age Head: Normocephalic, without obvious abnormality, atraumatic Neck: no adenopathy, supple, symmetrical, trachea midline and thyroid normal to inspection and palpation Lungs: clear to auscultation bilaterally Breasts: normal appearance, no masses or tenderness, No nipple retraction or dimpling, No nipple discharge or bleeding, No axillary or supraclavicular adenopathy Heart: regular rate and rhythm Abdomen: soft, non-tender; no masses,  no organomegaly Extremities: extremities normal, atraumatic, no cyanosis or edema Skin: Skin color, texture, turgor normal. No rashes or lesions Lymph nodes: Cervical, supraclavicular, and axillary nodes normal. No abnormal inguinal nodes palpated Neurologic: Grossly normal   Pelvic: External genitalia:  no lesions              Urethra:  normal appearing urethra with no masses, tenderness or lesions              Bartholin's and Skene's: normal                 Vagina:  normal appearing vagina with normal color and scant moisture, no lesions              Cervix: multiparous appearance, no cervical motion tenderness and no lesions              Pap taken: Yes.   Bimanual Exam:  Uterus:  normal size, contour, position, consistency, mobility, non-tender and anteverted              Adnexa: normal adnexa and no mass, fullness, tenderness               Rectovaginal: Confirms               Anus:  normal sphincter tone, no lesions  Chaperone present: yes  A:  Well Woman with normal exam  Menopausal no HRT  Vaginal dryness  Hypothyroid will need TSH results to renew  Family history of breast cancer mother  P:   Reviewed health and wellness pertinent to exam  Aware of need to evaluate if vaginal  bleeding  Discussed finding and etiology. Discussed treatment with coconut oil trial. Patient does not want hormonal use. Instructions given for use and will advise if no change or problems. Questions addressed and discussed increase of UTI and vaginal infection with dryness.  Request lab records. Patient has one month supply  Stressed SBE and mammogram importance  Pap smear as above with HPVHR    counseled on breast self exam, mammography screening, menopause, adequate intake of calcium and vitamin D, diet and exercise  return annually or prn  An After Visit Summary was printed and given to the patient.  Addendum: TSH 2.22 per records from Dr. Inda Merlin. Order placed for Synthroid renewal. Vitamin D 22.8

## 2015-07-05 ENCOUNTER — Telehealth: Payer: Self-pay

## 2015-07-05 ENCOUNTER — Other Ambulatory Visit: Payer: Self-pay | Admitting: Certified Nurse Midwife

## 2015-07-05 DIAGNOSIS — E039 Hypothyroidism, unspecified: Secondary | ICD-10-CM

## 2015-07-05 LAB — IPS PAP TEST WITH HPV

## 2015-07-05 MED ORDER — LEVOTHYROXINE SODIUM 50 MCG PO TABS: 50.0000 ug | ORAL_TABLET | Freq: Every day | ORAL | Status: DC

## 2015-07-05 NOTE — Addendum Note (Signed)
Addended by: Regina Eck on: 07/05/2015 12:33 PM   Modules accepted: Miquel Dunn

## 2015-07-05 NOTE — Telephone Encounter (Signed)
Pt aware we received her tsh labwork. Pt aware rx was sent to pharmacy. Labs to be scanned into epic.

## 2015-08-12 DIAGNOSIS — H2513 Age-related nuclear cataract, bilateral: Secondary | ICD-10-CM | POA: Diagnosis not present

## 2015-08-12 DIAGNOSIS — H01001 Unspecified blepharitis right upper eyelid: Secondary | ICD-10-CM | POA: Diagnosis not present

## 2015-08-12 DIAGNOSIS — H524 Presbyopia: Secondary | ICD-10-CM | POA: Diagnosis not present

## 2015-08-12 DIAGNOSIS — H25042 Posterior subcapsular polar age-related cataract, left eye: Secondary | ICD-10-CM | POA: Diagnosis not present

## 2016-04-14 DIAGNOSIS — F422 Mixed obsessional thoughts and acts: Secondary | ICD-10-CM | POA: Diagnosis not present

## 2016-04-16 DIAGNOSIS — L814 Other melanin hyperpigmentation: Secondary | ICD-10-CM | POA: Diagnosis not present

## 2016-04-16 DIAGNOSIS — D2261 Melanocytic nevi of right upper limb, including shoulder: Secondary | ICD-10-CM | POA: Diagnosis not present

## 2016-04-16 DIAGNOSIS — L821 Other seborrheic keratosis: Secondary | ICD-10-CM | POA: Diagnosis not present

## 2016-04-16 DIAGNOSIS — D2262 Melanocytic nevi of left upper limb, including shoulder: Secondary | ICD-10-CM | POA: Diagnosis not present

## 2016-07-02 ENCOUNTER — Other Ambulatory Visit: Payer: Self-pay | Admitting: Certified Nurse Midwife

## 2016-07-02 DIAGNOSIS — Z1231 Encounter for screening mammogram for malignant neoplasm of breast: Secondary | ICD-10-CM

## 2016-07-03 ENCOUNTER — Ambulatory Visit: Payer: BLUE CROSS/BLUE SHIELD | Admitting: Certified Nurse Midwife

## 2016-07-06 DIAGNOSIS — N63 Unspecified lump in unspecified breast: Secondary | ICD-10-CM

## 2016-07-06 HISTORY — DX: Unspecified lump in unspecified breast: N63.0

## 2016-07-08 ENCOUNTER — Ambulatory Visit (INDEPENDENT_AMBULATORY_CARE_PROVIDER_SITE_OTHER): Payer: BLUE CROSS/BLUE SHIELD | Admitting: Certified Nurse Midwife

## 2016-07-08 ENCOUNTER — Encounter: Payer: Self-pay | Admitting: Certified Nurse Midwife

## 2016-07-08 VITALS — BP 118/60 | HR 92 | Resp 16 | Ht 62.75 in | Wt 128.0 lb

## 2016-07-08 DIAGNOSIS — E039 Hypothyroidism, unspecified: Secondary | ICD-10-CM | POA: Diagnosis not present

## 2016-07-08 DIAGNOSIS — N951 Menopausal and female climacteric states: Secondary | ICD-10-CM

## 2016-07-08 DIAGNOSIS — Z01419 Encounter for gynecological examination (general) (routine) without abnormal findings: Secondary | ICD-10-CM

## 2016-07-08 DIAGNOSIS — N6310 Unspecified lump in the right breast, unspecified quadrant: Secondary | ICD-10-CM | POA: Diagnosis not present

## 2016-07-08 DIAGNOSIS — E559 Vitamin D deficiency, unspecified: Secondary | ICD-10-CM | POA: Diagnosis not present

## 2016-07-08 NOTE — Patient Instructions (Signed)

## 2016-07-08 NOTE — Progress Notes (Signed)
61 y.o. G37P2003 Married  Caucasian Fe here for annual exam. Menopausal no HRT, denies vaginal bleeding or dryness. Sees PCP every other year, this is her off year. All labs stable last year. Synthroid working well , no symptoms. Busy with keeping grandson as needed. No health issues today. Has mammogram scheduled.  Patient's last menstrual period was 06/12/2012.          Sexually active: No.  The current method of family planning is post menopausal status.    Exercising: Yes.    bike & walk Smoker:  no  Health Maintenance: Pap:  06-23-13 neg, 07-03-15 neg HPV HR neg History of Abnormal Pap: yes MMG:  07-02-15 category c density birads 1:neg, scheduled for monday Self Breast exams: occ Colonoscopy:  2016 polyps f/u 65yrs BMD:   none TDaP:  2017 Shingles: no Pneumonia: no Hep C and HIV: not done Labs: none   reports that she has never smoked. She has never used smokeless tobacco. She reports that she drinks about 3.6 - 4.8 oz of alcohol per week . She reports that she does not use drugs.  Past Medical History:  Diagnosis Date  . Depression   . MVP (mitral valve prolapse)   . Thyroid disease    hypothyroidism    Past Surgical History:  Procedure Laterality Date  . CESAREAN SECTION    . COLPOSCOPY     [name]    Current Outpatient Prescriptions  Medication Sig Dispense Refill  . Cholecalciferol (VITAMIN D PO) Take 4,000 Int'l Units by mouth.    . fluvoxaMINE (LUVOX) 50 MG tablet Take 150 mg by mouth at bedtime.     Marland Kitchen levothyroxine (SYNTHROID) 50 MCG tablet Take 1 tablet (50 mcg total) by mouth daily. 30 tablet 12  . Multiple Vitamin (MULTIVITAMIN) tablet Take 1 tablet by mouth daily.     No current facility-administered medications for this visit.     Family History  Problem Relation Age of Onset  . Hypertension Mother   . Breast cancer Mother   . Diabetes Father   . Colon cancer Neg Hx   . Stomach cancer Neg Hx     ROS:  Pertinent items are noted in HPI.  Otherwise, a  comprehensive ROS was negative.  Exam:   BP 118/60   Pulse 92   Resp 16   Ht 5' 2.75" (1.594 m)   Wt 128 lb (58.1 kg)   LMP 06/12/2012   BMI 22.86 kg/m  Height: 5' 2.75" (159.4 cm) Ht Readings from Last 3 Encounters:  07/08/16 5' 2.75" (1.594 m)  07/03/15 5' 2.25" (1.581 m)  06/28/14 5\' 2"  (1.575 m)    General appearance: alert, cooperative and appears stated age Head: Normocephalic, without obvious abnormality, atraumatic Neck: no adenopathy, supple, symmetrical, trachea midline and thyroid normal to inspection and palpation Lungs: clear to auscultation bilaterally Breasts: normal appearance, no masses or tenderness, No nipple retraction or dimpling, No nipple discharge or bleeding, No axillary or supraclavicular adenopathy, positive findings: questionable mass noted in right breast at 12 o'clock at aerola edge, 1 cm ? cluster around mass Heart: regular rate and rhythm Abdomen: soft, non-tender; no masses,  no organomegaly Extremities: extremities normal, atraumatic, no cyanosis or edema Skin: Skin color, texture, turgor normal. No rashes or lesions Lymph nodes: Cervical, supraclavicular, and axillary nodes normal. No abnormal inguinal nodes palpated Neurologic: Grossly normal   Pelvic: External genitalia:  no lesions              Urethra:  normal appearing urethra with no masses, tenderness or lesions              Bartholin's and Skene's: normal                 Vagina: normal appearing vagina with normal color and discharge, no lesions              Cervix: no cervical motion tenderness, no lesions and normal appearance              Pap taken: No. Bimanual Exam:  Uterus:  normal size, contour, position, consistency, mobility, non-tender              Adnexa: normal adnexa and no mass, fullness, tenderness               Rectovaginal: Confirms               Anus:  normal sphincter tone, no lesions  Chaperone present: yes  A:  Well Woman with normal exam  Menopausal no  HRT  Hypothyroidism on stable medication  Right breast mass   Screening labs  P:   Reviewed health and wellness pertinent to exam  Aware of need to advise if vaginal bleeding. Use coconut oil prn for vaginal dryness  Will do lab TSH and renew Rx Synthroid once labs in. Has enough RX until then.  Discussed finding in right breast and need for evaluation with diagnostic mammogram and [place]. Patient agreeable and will be scheduled prior to leaving office today. Questions addressed.  Lab: Vitamin D   counseled on breast self exam, mammography screening, menopause, adequate intake of calcium and vitamin D, diet and exercise, Kegel's exercises  return annually or prn  An After Visit Summary was printed and given to the patient.

## 2016-07-08 NOTE — Progress Notes (Signed)
Scheduled patient while in office for bilateral diagnostic mammogram with right breast ultrasound at the [facility] on 07/13/2016 at 9 am. Patient is agreeable to date and time. Placed in mammogram hold.

## 2016-07-09 ENCOUNTER — Other Ambulatory Visit: Payer: Self-pay | Admitting: Certified Nurse Midwife

## 2016-07-09 DIAGNOSIS — E039 Hypothyroidism, unspecified: Secondary | ICD-10-CM

## 2016-07-09 LAB — VITAMIN D 25 HYDROXY (VIT D DEFICIENCY, FRACTURES): Vit D, 25-Hydroxy: 36.8 ng/mL (ref 30.0–100.0)

## 2016-07-09 LAB — TSH: TSH: 1.91 u[IU]/mL (ref 0.450–4.500)

## 2016-07-09 MED ORDER — LEVOTHYROXINE SODIUM 50 MCG PO TABS: 50.0000 ug | ORAL_TABLET | Freq: Every day | ORAL | 12 refills | Status: DC

## 2016-07-13 ENCOUNTER — Ambulatory Visit: Payer: BLUE CROSS/BLUE SHIELD

## 2016-07-13 ENCOUNTER — Ambulatory Visit
Admission: RE | Admit: 2016-07-13 | Discharge: 2016-07-13 | Disposition: A | Discharge status: Home / Self Care | Payer: BLUE CROSS/BLUE SHIELD | Source: Ambulatory Visit | Attending: Certified Nurse Midwife | Admitting: Certified Nurse Midwife

## 2016-07-13 DIAGNOSIS — N6310 Unspecified lump in the right breast, unspecified quadrant: Secondary | ICD-10-CM

## 2016-07-13 DIAGNOSIS — R922 Inconclusive mammogram: Secondary | ICD-10-CM | POA: Diagnosis not present

## 2016-07-13 DIAGNOSIS — N6001 Solitary cyst of right breast: Secondary | ICD-10-CM | POA: Diagnosis not present

## 2016-07-13 HISTORY — DX: Unspecified lump in unspecified breast: N63.0

## 2016-07-16 ENCOUNTER — Telehealth: Payer: Self-pay | Admitting: *Deleted

## 2016-07-16 NOTE — Telephone Encounter (Signed)
-----   Message from Regina Eck, CNM sent at 07/13/2016 10:19 AM EDT ----- Notify patient that her mammogram showed no masses, but the US showed a simple cyst in the area of concern I palpated. No other concern noted. Would like to recheck area. She has C density and should have 3D mammogram yearly.

## 2016-07-16 NOTE — Telephone Encounter (Signed)
Pt notified. Verbalized understanding. appt made 07/22/16 @4 :00pm

## 2016-07-16 NOTE — Telephone Encounter (Signed)
LM for pt to call back.

## 2016-07-22 ENCOUNTER — Ambulatory Visit (INDEPENDENT_AMBULATORY_CARE_PROVIDER_SITE_OTHER): Payer: BLUE CROSS/BLUE SHIELD | Admitting: Certified Nurse Midwife

## 2016-07-22 ENCOUNTER — Encounter: Payer: Self-pay | Admitting: Certified Nurse Midwife

## 2016-07-22 VITALS — BP 100/60 | HR 70 | Resp 16 | Ht 62.75 in | Wt 129.0 lb

## 2016-07-22 DIAGNOSIS — N6001 Solitary cyst of right breast: Secondary | ICD-10-CM | POA: Diagnosis not present

## 2016-07-22 NOTE — Progress Notes (Signed)
   Subjective:   61 y.o. MarriedvCaucasian female presents for recheck of  right breast mass. Patient was noted to have cystic feel area on right breast at aex and has diagnostic mammogram and [place] of area which revealed simple cyst at 12 o'clock in area of concern. Benign findings. Review of Systems Pertinent items are noted in HPI.   Objective:   General appearance: alert, cooperative and appears stated age Breasts: normal appearance, no masses or tenderness, No nipple retraction or dimpling, No nipple discharge or bleeding, No axillary or supraclavicular adenopathy, positive findings: right breast cyst at 12 o'clock, non tender, no change from previous exam. Axillary area no masses, non tender, no enlarged lymph nodes.    Assessment:   ASSESSMENT:Patient is diagnosed with right breast simple cyst, benign finding confirmed on [place] and diagnostic mammogram   Plan:   PLAN: Discussed finding with patient and continue SBE. Discussed this may remain or may resolve. Vitamin E has been found to help with healthy breast tissue.May take OTC 400 IU Vitamin E if desires. Recheck breast if patient notes a change or areas of breast concern. Questions addressed.  Rv prn

## 2016-09-07 DIAGNOSIS — H2513 Age-related nuclear cataract, bilateral: Secondary | ICD-10-CM | POA: Diagnosis not present

## 2016-09-07 DIAGNOSIS — H43813 Vitreous degeneration, bilateral: Secondary | ICD-10-CM | POA: Diagnosis not present

## 2016-09-07 DIAGNOSIS — H25042 Posterior subcapsular polar age-related cataract, left eye: Secondary | ICD-10-CM | POA: Diagnosis not present

## 2016-09-07 DIAGNOSIS — H524 Presbyopia: Secondary | ICD-10-CM | POA: Diagnosis not present

## 2016-12-04 ENCOUNTER — Emergency Department (HOSPITAL_COMMUNITY)
Admission: EM | Admit: 2016-12-04 | Discharge: 2016-12-05 | Disposition: A | Discharge status: Other Acute Inpatient Hospital | Payer: BLUE CROSS/BLUE SHIELD | Attending: Emergency Medicine | Admitting: Emergency Medicine

## 2016-12-04 ENCOUNTER — Encounter (HOSPITAL_COMMUNITY): Payer: Self-pay

## 2016-12-04 DIAGNOSIS — Y9389 Activity, other specified: Secondary | ICD-10-CM | POA: Insufficient documentation

## 2016-12-04 DIAGNOSIS — S01111A Laceration without foreign body of right eyelid and periocular area, initial encounter: Secondary | ICD-10-CM | POA: Insufficient documentation

## 2016-12-04 DIAGNOSIS — S0993XA Unspecified injury of face, initial encounter: Secondary | ICD-10-CM | POA: Diagnosis present

## 2016-12-04 DIAGNOSIS — E039 Hypothyroidism, unspecified: Secondary | ICD-10-CM | POA: Diagnosis not present

## 2016-12-04 DIAGNOSIS — W268XXA Contact with other sharp object(s), not elsewhere classified, initial encounter: Secondary | ICD-10-CM | POA: Insufficient documentation

## 2016-12-04 DIAGNOSIS — Y92000 Kitchen of unspecified non-institutional (private) residence as  the place of occurrence of the external cause: Secondary | ICD-10-CM | POA: Insufficient documentation

## 2016-12-04 DIAGNOSIS — Y999 Unspecified external cause status: Secondary | ICD-10-CM | POA: Diagnosis not present

## 2016-12-04 MED ORDER — PROPARACAINE HCL 0.5 % OP SOLN
1.0000 [drp] | Freq: Once | OPHTHALMIC | Status: AC
  Administered 2016-12-04: 1 [drp] via OPHTHALMIC
  Filled 2016-12-04: qty 15

## 2016-12-04 MED ORDER — FLUORESCEIN SODIUM 1 MG OP STRP
1.0000 | ORAL_STRIP | Freq: Once | OPHTHALMIC | Status: AC
  Administered 2016-12-04: 1 via OPHTHALMIC
  Filled 2016-12-04: qty 1

## 2016-12-04 NOTE — ED Triage Notes (Signed)
Pt presents with c/o right eye injury. Pt was taking the dishes out of the dishwasher and as she was putting a platter away, it fell and hit her in the face, breaking as it fell. Pt has a significant cut close to the tear duct of her right eye, bleeding controlled. Eye is very red with possible glass still in her eye.

## 2016-12-04 NOTE — ED Provider Notes (Signed)
[facility] DEPT Provider Note   CSN: [id number] Arrival date & time: 12/04/16  2118     History   Chief Complaint Chief Complaint  Patient presents with  . Eye Problem    HPI Nicole Singleton is a 61 y.o. female.  Nicole Singleton is a 60 y.o. Female who presents to the ED with a right eye injury sustained prior to arrival. Patient reports she was putting ceramic platter up in a cupboard when it slipped and fell breaking and hitting her in her right eye.  She reports she does wear contacts and remove these immediately.  She tells me her vision is exactly the same in both of her eyes.  It is not worsened or changed in her right eye.  She has no double vision, blurry vision or black spots in her vision.  No loss of vision. Tdap is up to date. No facial pain. No LOC. She denies other complaints.    The history is provided by the patient and medical records. No language interpreter was used.  Eye Problem   Associated symptoms include eye redness. Pertinent negatives include no numbness, no photophobia, no nausea, no vomiting and no weakness.    Past Medical History:  Diagnosis Date  . Abnormal Pap smear of cervix    age 47  . Breast mass 07/06/2016   Right breast lump felt by DR   . Depression   . MVP (mitral valve prolapse)   . Thyroid disease    hypothyroidism    Patient Active Problem List   Diagnosis Date Noted  . Hypothyroidism 06/28/2014    Class: History of    Past Surgical History:  Procedure Laterality Date  . CESAREAN SECTION    . COLPOSCOPY     [name] maybe age 72    OB History    Gravida Para Term Preterm AB Living   2 2 2     3    SAB TAB Ectopic Multiple Live Births         1 3       Home Medications    Prior to Admission medications   Medication Sig Start Date End Date Taking? Authorizing Provider  Cholecalciferol (VITAMIN D PO) Take 4,000 Int'l Units by mouth daily.    Yes [provider]  fluvoxaMINE  (LUVOX) 50 MG tablet Take 150 mg by mouth at bedtime.    Yes [provider]  levothyroxine (SYNTHROID) 50 MCG tablet Take 1 tablet (50 mcg total) by mouth daily. 07/09/16  Yes Regina Eck, CNM  Multiple Vitamin (MULTIVITAMIN) tablet Take 1 tablet by mouth daily.   Yes [provider]    Family History Family History  Problem Relation Age of Onset  . Hypertension Mother   . Breast cancer Mother   . Diabetes Father   . Colon cancer Neg Hx   . Stomach cancer Neg Hx     Social History Social History   Tobacco Use  . Smoking status: Never Smoker  . Smokeless tobacco: Never Used  Substance Use Topics  . Alcohol use: Yes    Alcohol/week: 3.6 - 4.8 oz    Types: 6 - 8 Standard drinks or equivalent per week  . Drug use: No     Allergies   Patient has no known allergies.   Review of Systems Review of Systems  Constitutional: Negative for chills and fever.  HENT: Negative for congestion and sore throat.   Eyes: Positive for pain  and redness. Negative for photophobia and visual disturbance.  Respiratory: Negative for cough and shortness of breath.   Cardiovascular: Negative for chest pain.  Gastrointestinal: Negative for abdominal pain, nausea and vomiting.  Genitourinary: Negative for dysuria.  Musculoskeletal: Negative for neck pain.  Skin: Positive for wound. Negative for rash.  Neurological: Negative for syncope, weakness, light-headedness, numbness and headaches.     Physical Exam Updated Vital Signs BP (!) 141/83 (BP Location: Left Arm)   Pulse 95   Temp 98.3 F (36.8 C) (Oral)   Resp 18   Ht 5\' 3"  (1.6 m)   Wt 59 kg (130 lb)   LMP 06/12/2012   SpO2 98%   BMI 23.03 kg/m   Physical Exam  Constitutional: She is oriented to person, place, and time. She appears well-developed and well-nourished. No distress.  HENT:  Head: Normocephalic.  Right Ear: External ear normal.  Left Ear: External ear normal.  Eyes: EOM are normal. Pupils are  equal, round, and reactive to light. Right eye exhibits no discharge. Left eye exhibits no discharge.  Laceration noted to right medial lower eyelid. It extends through lower lid margin. Near medial canthus. See pictures below. Right medial sub-conjunctival hemorrhage noted. EOMs intact. Vision is grossly intact.  Right eye was anesthetized with proparacaine and stained with fluorescein.  No fluorescein uptake on exam.  No evidence of Seidel sign.    Neck: Neck supple.  Cardiovascular: Normal rate, regular rhythm, normal heart sounds and intact distal pulses.  Pulmonary/Chest: Effort normal and breath sounds normal. No respiratory distress.  Abdominal: Soft. There is no tenderness.  Musculoskeletal: She exhibits no edema.  Lymphadenopathy:    She has no cervical adenopathy.  Neurological: She is alert and oriented to person, place, and time. No cranial nerve deficit. Coordination normal.  Skin: Skin is warm and dry. No rash noted. She is not diaphoretic.  Psychiatric: She has a normal mood and affect. Her behavior is normal.  Nursing note and vitals reviewed.        ED Treatments / Results  Labs (all labs ordered are listed, but only abnormal results are displayed) Labs Reviewed - No data to display  EKG  EKG Interpretation None       Radiology No results found.  Procedures Procedures (including critical care time)  Medications Ordered in ED Medications  fluorescein ophthalmic strip 1 strip (1 strip Both Eyes Given by Other 12/04/16 2226)  proparacaine (ALCAINE) 0.5 % ophthalmic solution 1 drop (1 drop Both Eyes Given by Other 12/04/16 2226)     Initial Impression / Assessment and Plan / ED Course  I have reviewed the triage vital signs and the nursing notes.  Pertinent labs & imaging results that were available during my care of the patient were reviewed by me and considered in my medical decision making (see chart for details).  Clinical Course as of Dec 05 2343    Fri Dec 04, 2016  2248 I consulted with Dr. Noel Journey, our on call ophthalmologist, who reports her injury is out of his scope of practice and recommends sending to [facility].    [WD]  2300 I spoke with [facility] ophthalmologist Dr. Vergia Alberts who will accept the patient in transfer.  She reports their protocol is for the on-call ophthalmologist to evaluate the patient in person prior to transfer.  Will call back after this evaluation for transfer.  [WD]  2305 I spoke again with on call ophthalmologist Dr. Noel Journey who reports he will be in  to see the patient prior to transfer.   [WD]    Clinical Course User Index [WD] Waynetta Pean, PA-C   This is a 61 y.o. Female who presents to the ED with a right eye injury sustained prior to arrival. Patient reports she was putting ceramic platter up in a cupboard when it slipped and fell breaking and hitting her in her right eye.  She reports she does wear contacts and remove these immediately.  She tells me her vision is exactly the same in both of her eyes.  It is not worsened or changed in her right eye.  On exam patient has a complex laceration to her right lower eyelid near her medial canthus.  It goes through the lid margin.  Concern for lacrimal duct injury.  On examination there is no fluorescein uptake.  EOMs are intact.  Vision is grossly intact.   Consults as above.  Ophthalmologist Dr. Noel Journey in to see the patient and reports this is out of his scope of practice.  Will transfer to [facility].  Will send the patient to the emergency department per her ophthalmologist Dr. Vergia Alberts. Patient elected to go POV. PAL line at [facility] notified ER.   This patient was discussed with and evaluated by Dr. Tomi Bamberger who agrees with assessment and plan.  Final Clinical Impressions(s) / ED Diagnoses   Final diagnoses:  Right eyelid laceration, initial encounter    ED Discharge Orders    None       Sharmaine Base 12/04/16 2350    Dorie Rank,  MD 12/05/16 [phone]

## 2016-12-04 NOTE — ED Provider Notes (Signed)
Patient presented to the emergency room with a laceration to her lid margin with concerns of a nasolacrimal duct injury.  Patient has no evidence of hyphema.  No evidence of globe injury.  We consulted with ophthalmology.  Dr. Noel Journey came and evaluated the patient in the emergency room.  He felt that the patient required specialized ophthalmologic care and recommended transfer to [place] were made to have the patient transferred for further treatment and evaluation.  Medical screening examination/treatment/procedure(s) were conducted as a shared visit with non-physician practitioner(s) and myself.  I personally evaluated the patient during the encounter.     Dorie Rank, MD 12/04/16 [phone]

## 2016-12-05 DIAGNOSIS — S01111A Laceration without foreign body of right eyelid and periocular area, initial encounter: Secondary | ICD-10-CM | POA: Diagnosis not present

## 2016-12-05 DIAGNOSIS — W1839XA Other fall on same level, initial encounter: Secondary | ICD-10-CM | POA: Diagnosis not present

## 2016-12-05 DIAGNOSIS — Y999 Unspecified external cause status: Secondary | ICD-10-CM | POA: Diagnosis not present

## 2016-12-05 DIAGNOSIS — H268 Other specified cataract: Secondary | ICD-10-CM | POA: Diagnosis not present

## 2016-12-05 DIAGNOSIS — H1131 Conjunctival hemorrhage, right eye: Secondary | ICD-10-CM | POA: Diagnosis not present

## 2016-12-11 DIAGNOSIS — S01111D Laceration without foreign body of right eyelid and periocular area, subsequent encounter: Secondary | ICD-10-CM | POA: Diagnosis not present

## 2016-12-11 DIAGNOSIS — H1131 Conjunctival hemorrhage, right eye: Secondary | ICD-10-CM | POA: Diagnosis not present

## 2016-12-11 DIAGNOSIS — H2513 Age-related nuclear cataract, bilateral: Secondary | ICD-10-CM | POA: Diagnosis not present

## 2017-04-13 DIAGNOSIS — F422 Mixed obsessional thoughts and acts: Secondary | ICD-10-CM | POA: Diagnosis not present

## 2017-04-28 DIAGNOSIS — D2339 Other benign neoplasm of skin of other parts of face: Secondary | ICD-10-CM | POA: Diagnosis not present

## 2017-04-28 DIAGNOSIS — D225 Melanocytic nevi of trunk: Secondary | ICD-10-CM | POA: Diagnosis not present

## 2017-04-28 DIAGNOSIS — D2262 Melanocytic nevi of left upper limb, including shoulder: Secondary | ICD-10-CM | POA: Diagnosis not present

## 2017-04-28 DIAGNOSIS — D485 Neoplasm of uncertain behavior of skin: Secondary | ICD-10-CM | POA: Diagnosis not present

## 2017-04-28 DIAGNOSIS — D692 Other nonthrombocytopenic purpura: Secondary | ICD-10-CM | POA: Diagnosis not present

## 2017-04-28 DIAGNOSIS — D2261 Melanocytic nevi of right upper limb, including shoulder: Secondary | ICD-10-CM | POA: Diagnosis not present

## 2017-05-03 DIAGNOSIS — E559 Vitamin D deficiency, unspecified: Secondary | ICD-10-CM | POA: Diagnosis not present

## 2017-05-03 DIAGNOSIS — Z0000 Encounter for general adult medical examination without abnormal findings: Secondary | ICD-10-CM | POA: Diagnosis not present

## 2017-05-03 DIAGNOSIS — F329 Major depressive disorder, single episode, unspecified: Secondary | ICD-10-CM | POA: Diagnosis not present

## 2017-05-03 DIAGNOSIS — B1910 Unspecified viral hepatitis B without hepatic coma: Secondary | ICD-10-CM | POA: Diagnosis not present

## 2017-05-03 DIAGNOSIS — J309 Allergic rhinitis, unspecified: Secondary | ICD-10-CM | POA: Diagnosis not present

## 2017-05-03 DIAGNOSIS — F429 Obsessive-compulsive disorder, unspecified: Secondary | ICD-10-CM | POA: Diagnosis not present

## 2017-05-03 DIAGNOSIS — Z79899 Other long term (current) drug therapy: Secondary | ICD-10-CM | POA: Diagnosis not present

## 2017-05-03 DIAGNOSIS — E039 Hypothyroidism, unspecified: Secondary | ICD-10-CM | POA: Diagnosis not present

## 2017-06-30 ENCOUNTER — Other Ambulatory Visit: Payer: Self-pay | Admitting: Certified Nurse Midwife

## 2017-06-30 DIAGNOSIS — Z1231 Encounter for screening mammogram for malignant neoplasm of breast: Secondary | ICD-10-CM

## 2017-07-09 ENCOUNTER — Ambulatory Visit (INDEPENDENT_AMBULATORY_CARE_PROVIDER_SITE_OTHER): Payer: BLUE CROSS/BLUE SHIELD | Admitting: Certified Nurse Midwife

## 2017-07-09 ENCOUNTER — Other Ambulatory Visit: Payer: Self-pay

## 2017-07-09 ENCOUNTER — Other Ambulatory Visit (HOSPITAL_COMMUNITY)
Admission: RE | Admit: 2017-07-09 | Discharge: 2017-07-09 | Disposition: A | Discharge status: Home / Self Care | Payer: BLUE CROSS/BLUE SHIELD | Source: Ambulatory Visit | Attending: Certified Nurse Midwife | Admitting: Certified Nurse Midwife

## 2017-07-09 ENCOUNTER — Encounter: Payer: Self-pay | Admitting: Certified Nurse Midwife

## 2017-07-09 VITALS — BP 110/70 | HR 64 | Resp 16 | Ht 62.5 in | Wt 129.0 lb

## 2017-07-09 DIAGNOSIS — Z01419 Encounter for gynecological examination (general) (routine) without abnormal findings: Secondary | ICD-10-CM

## 2017-07-09 DIAGNOSIS — E038 Other specified hypothyroidism: Secondary | ICD-10-CM | POA: Insufficient documentation

## 2017-07-09 DIAGNOSIS — E559 Vitamin D deficiency, unspecified: Secondary | ICD-10-CM | POA: Diagnosis not present

## 2017-07-09 DIAGNOSIS — Z124 Encounter for screening for malignant neoplasm of cervix: Secondary | ICD-10-CM

## 2017-07-09 NOTE — Addendum Note (Signed)
Addended by: Regina Eck on: 07/09/2017 04:50 PM   Modules accepted: Orders

## 2017-07-09 NOTE — Patient Instructions (Signed)

## 2017-07-09 NOTE — Progress Notes (Signed)
62 y.o. G39P2003 Married  Caucasian Fe here for annual exam. Menopausal no HRT. Denies vaginal bleeding or vaginal dryness. Sees PCP Dr. Inda Merlin yearly with labs, except Vitamin D and TSH. All normal per patient.Staying busy and working on weight maintenance. Denies any health issues today. Mammogram scheduled for next month.  Patient's last menstrual period was 06/12/2012.          Sexually active: No.  The current method of family planning is post menopausal status.    Exercising: Yes.    bike, housework, yardwork Smoker:  no  Health Maintenance: Pap:  06-23-13 neg, 07-03-15 neg HPV HR neg History of Abnormal Pap: yes MMG:  7/18 bilateral & right breast u/s birads 2:neg Self Breast exams: yes Colonoscopy:  2016 polyps f/u 44yrs BMD:   none TDaP:  2017 Shingles: no Pneumonia: no Hep C and HIV: not done Labs: as needed   reports that she has never smoked. She has never used smokeless tobacco. She reports that she drinks about 4.8 oz of alcohol per week. She reports that she does not use drugs.  Past Medical History:  Diagnosis Date  . Abnormal Pap smear of cervix    age 70  . Breast mass 07/06/2016   Right breast lump felt by DR   . Depression   . MVP (mitral valve prolapse)   . Thyroid disease    hypothyroidism    Past Surgical History:  Procedure Laterality Date  . CESAREAN SECTION    . COLPOSCOPY     [name] maybe age 35    Current Outpatient Medications  Medication Sig Dispense Refill  . Cholecalciferol (VITAMIN D PO) Take 4,000 Int'l Units by mouth daily.     . fluvoxaMINE (LUVOX) 50 MG tablet Take 150 mg by mouth at bedtime.     Marland Kitchen levothyroxine (SYNTHROID) 50 MCG tablet Take 1 tablet (50 mcg total) by mouth daily. 30 tablet 12  . Multiple Vitamin (MULTIVITAMIN) tablet Take 1 tablet by mouth daily.     No current facility-administered medications for this visit.     Family History  Problem Relation Age of Onset  . Hypertension Mother   . Breast cancer Mother   .  Diabetes Father   . Colon cancer Neg Hx   . Stomach cancer Neg Hx     ROS:  Pertinent items are noted in HPI.  Otherwise, a comprehensive ROS was negative.  Exam:   BP 110/70   Pulse 64   Resp 16   Ht 5' 2.5" (1.588 m)   Wt 129 lb (58.5 kg)   LMP 06/12/2012   BMI 23.22 kg/m  Height: 5' 2.5" (158.8 cm) Ht Readings from Last 3 Encounters:  07/09/17 5' 2.5" (1.588 m)  12/04/16 5\' 3"  (1.6 m)  07/22/16 5' 2.75" (1.594 m)    General appearance: alert, cooperative and appears stated age Head: Normocephalic, without obvious abnormality, atraumatic Neck: no adenopathy, supple, symmetrical, trachea midline and thyroid normal to inspection and palpation Lungs: clear to auscultation bilaterally Breasts: normal appearance, no masses or tenderness, No nipple retraction or dimpling, No nipple discharge or bleeding, No axillary or supraclavicular adenopathy, history of right breast cyst, not palpated today Heart: regular rate and rhythm Abdomen: soft, non-tender; no masses,  no organomegaly Extremities: extremities normal, atraumatic, no cyanosis or edema Skin: Skin color, texture, turgor normal. No rashes or lesions Lymph nodes: Cervical, supraclavicular, and axillary nodes normal. No abnormal inguinal nodes palpated Neurologic: Grossly normal   Pelvic: External genitalia:  no  lesions, normal appearance              Urethra:  normal appearing urethra with no masses, tenderness or lesions              Bartholin's and Skene's: normal                 Vagina: normal appearing vagina with normal color and discharge, no lesions              Cervix: no cervical motion tenderness, no lesions and normal appearance              Pap taken: Yes.   Bimanual Exam:  Uterus:  normal size, contour, position, consistency, mobility, non-tender and anteverted              Adnexa: normal adnexa and no mass, fullness, tenderness               Rectovaginal: Confirms               Anus:  normal sphincter tone,  no lesions  Chaperone present: yes  A:  Well Woman with normal exam  Menopausal no HRT  Hypothyroid not symptomatic medication stable  History of right breast cyst not palpable on exam today    P:   Reviewed health and wellness pertinent to exam  Aware of need to advise if vaginal bleeding  Discuss need to take first thing of morning and then eat one hour later for best absorption  Lab: TSH will refill once level in ( has enough on hand)  Keep mammogram appointment for follow up  Lab: Vitamin D  Pap smear: yes   counseled on breast self exam, mammography screening, feminine hygiene, adequate intake of calcium and vitamin D, diet and exercise  return annually or prn  An After Visit Summary was printed and given to the patient.

## 2017-07-10 LAB — TSH: TSH: 3.07 u[IU]/mL (ref 0.450–4.500)

## 2017-07-10 LAB — VITAMIN D 25 HYDROXY (VIT D DEFICIENCY, FRACTURES): Vit D, 25-Hydroxy: 50.6 ng/mL (ref 30.0–100.0)

## 2017-07-11 ENCOUNTER — Other Ambulatory Visit: Payer: Self-pay | Admitting: Certified Nurse Midwife

## 2017-07-11 DIAGNOSIS — E039 Hypothyroidism, unspecified: Secondary | ICD-10-CM

## 2017-07-11 MED ORDER — LEVOTHYROXINE SODIUM 50 MCG PO TABS: 50.0000 ug | ORAL_TABLET | Freq: Every day | ORAL | 12 refills | Status: DC

## 2017-07-13 LAB — CYTOLOGY - PAP
Diagnosis: NEGATIVE
HPV (WINDOPATH): NOT DETECTED

## 2017-07-21 ENCOUNTER — Ambulatory Visit
Admission: RE | Admit: 2017-07-21 | Discharge: 2017-07-21 | Disposition: A | Discharge status: Home / Self Care | Payer: BLUE CROSS/BLUE SHIELD | Source: Ambulatory Visit | Attending: Certified Nurse Midwife | Admitting: Certified Nurse Midwife

## 2017-07-21 DIAGNOSIS — Z1231 Encounter for screening mammogram for malignant neoplasm of breast: Secondary | ICD-10-CM

## 2017-08-15 ENCOUNTER — Other Ambulatory Visit: Payer: Self-pay | Admitting: Certified Nurse Midwife

## 2017-08-15 DIAGNOSIS — E039 Hypothyroidism, unspecified: Secondary | ICD-10-CM

## 2017-09-28 DIAGNOSIS — H66002 Acute suppurative otitis media without spontaneous rupture of ear drum, left ear: Secondary | ICD-10-CM | POA: Diagnosis not present

## 2017-09-28 DIAGNOSIS — J0110 Acute frontal sinusitis, unspecified: Secondary | ICD-10-CM | POA: Diagnosis not present

## 2017-09-28 DIAGNOSIS — H1033 Unspecified acute conjunctivitis, bilateral: Secondary | ICD-10-CM | POA: Diagnosis not present

## 2017-10-05 DIAGNOSIS — H66002 Acute suppurative otitis media without spontaneous rupture of ear drum, left ear: Secondary | ICD-10-CM | POA: Diagnosis not present

## 2017-10-05 DIAGNOSIS — H1033 Unspecified acute conjunctivitis, bilateral: Secondary | ICD-10-CM | POA: Diagnosis not present

## 2017-10-05 DIAGNOSIS — J0110 Acute frontal sinusitis, unspecified: Secondary | ICD-10-CM | POA: Diagnosis not present

## 2017-10-07 DIAGNOSIS — H90A32 Mixed conductive and sensorineural hearing loss, unilateral, left ear with restricted hearing on the contralateral side: Secondary | ICD-10-CM | POA: Insufficient documentation

## 2017-10-07 DIAGNOSIS — H6502 Acute serous otitis media, left ear: Secondary | ICD-10-CM | POA: Insufficient documentation

## 2017-10-07 DIAGNOSIS — H6982 Other specified disorders of Eustachian tube, left ear: Secondary | ICD-10-CM | POA: Diagnosis not present

## 2017-10-07 DIAGNOSIS — H90A12 Conductive hearing loss, unilateral, left ear with restricted hearing on the contralateral side: Secondary | ICD-10-CM | POA: Diagnosis not present

## 2017-10-07 DIAGNOSIS — H90A21 Sensorineural hearing loss, unilateral, right ear, with restricted hearing on the contralateral side: Secondary | ICD-10-CM | POA: Diagnosis not present

## 2017-11-23 DIAGNOSIS — H903 Sensorineural hearing loss, bilateral: Secondary | ICD-10-CM | POA: Diagnosis not present

## 2018-01-26 DIAGNOSIS — H43813 Vitreous degeneration, bilateral: Secondary | ICD-10-CM | POA: Diagnosis not present

## 2018-01-26 DIAGNOSIS — H2512 Age-related nuclear cataract, left eye: Secondary | ICD-10-CM | POA: Diagnosis not present

## 2018-01-26 DIAGNOSIS — H25042 Posterior subcapsular polar age-related cataract, left eye: Secondary | ICD-10-CM | POA: Diagnosis not present

## 2018-01-26 DIAGNOSIS — H524 Presbyopia: Secondary | ICD-10-CM | POA: Diagnosis not present

## 2018-02-02 ENCOUNTER — Encounter: Payer: Self-pay | Admitting: Emergency Medicine

## 2018-02-02 DIAGNOSIS — F429 Obsessive-compulsive disorder, unspecified: Secondary | ICD-10-CM | POA: Insufficient documentation

## 2018-03-02 DIAGNOSIS — L814 Other melanin hyperpigmentation: Secondary | ICD-10-CM | POA: Diagnosis not present

## 2018-03-02 DIAGNOSIS — B078 Other viral warts: Secondary | ICD-10-CM | POA: Diagnosis not present

## 2018-04-12 ENCOUNTER — Ambulatory Visit: Payer: Self-pay | Admitting: Psychiatry

## 2018-05-16 ENCOUNTER — Other Ambulatory Visit: Payer: Self-pay

## 2018-05-16 ENCOUNTER — Ambulatory Visit (INDEPENDENT_AMBULATORY_CARE_PROVIDER_SITE_OTHER): Payer: BLUE CROSS/BLUE SHIELD | Admitting: Psychiatry

## 2018-05-16 ENCOUNTER — Encounter: Payer: Self-pay | Admitting: Psychiatry

## 2018-05-16 DIAGNOSIS — F3340 Major depressive disorder, recurrent, in remission, unspecified: Secondary | ICD-10-CM | POA: Diagnosis not present

## 2018-05-16 DIAGNOSIS — F422 Mixed obsessional thoughts and acts: Secondary | ICD-10-CM

## 2018-05-16 MED ORDER — FLUVOXAMINE MALEATE 50 MG PO TABS: 150.0000 mg | ORAL_TABLET | Freq: Every day | ORAL | 3 refills | Status: DC

## 2018-05-16 NOTE — Progress Notes (Signed)
Nicole Singleton [id number] Apr 24, 1955 63 y.o.   Virtual Visit via Telephone Note  I connected with pt by telephone and verified that I am speaking with the correct person using two identifiers.   I discussed the limitations, risks, security and privacy concerns of performing an evaluation and management service by telephone and the availability of in person appointments. I also discussed with the patient that there may be a patient responsible charge related to this service. The patient expressed understanding and agreed to proceed.  I discussed the assessment and treatment plan with the patient. The patient was provided an opportunity to ask questions and all were answered. The patient agreed with the plan and demonstrated an understanding of the instructions.   The patient was advised to call back or seek an in-person evaluation if the symptoms worsen or if the condition fails to improve as anticipated.  I provided 10 minutes of non-face-to-face time during this encounter. The call started at 1025 and ended at 1030. The patient was located at home and the provider was located office.   Subjective:   Patient ID:  Nicole Singleton is a 63 y.o. (DOB Dec 23, 1955) female.  Chief Complaint:  Chief Complaint  Patient presents with  . Follow-up    Medication Management    HPI Nicole Singleton FU OCD and depression.  On Luvox for 63 yo.  Still doing well.  OCD mainly was checking and is managed.  No history of obs health fears.  Managing well.  Patient reports stable mood and denies depressed or irritable moods.  Patient denies any recent difficulty with anxiety.  Patient denies difficulty with sleep initiation or maintenance. Denies appetite disturbance.  Patient reports that energy and motivation have been good.  Patient denies any difficulty with concentration.  Patient denies any suicidal ideation.    Review of Systems:  Review of Systems  Neurological: Negative for tremors and  weakness.    Medications: I have reviewed the patient's current medications.  Current Outpatient Medications  Medication Sig Dispense Refill  . Cholecalciferol (VITAMIN D PO) Take 4,000 Int'l Units by mouth daily.     . fluvoxaMINE (LUVOX) 50 MG tablet Take 150 mg by mouth at bedtime.     Marland Kitchen levothyroxine (SYNTHROID) 50 MCG tablet Take 1 tablet (50 mcg total) by mouth daily. 30 tablet 12  . Multiple Vitamin (MULTIVITAMIN) tablet Take 1 tablet by mouth daily.     No current facility-administered medications for this visit.     Medication Side Effects: None  No sleepiness anyhmore. Allergies: No Known Allergies  Past Medical History:  Diagnosis Date  . Abnormal Pap smear of cervix    age 24  . Breast mass 07/06/2016   Right breast lump felt by DR   . Depression   . MVP (mitral valve prolapse)   . Thyroid disease    hypothyroidism    Family History  Problem Relation Age of Onset  . Hypertension Mother   . Breast cancer Mother   . Diabetes Father   . Colon cancer Neg Hx   . Stomach cancer Neg Hx     Social History   Socioeconomic History  . Marital status: Married    Spouse name: Not on file  . Number of children: Not on file  . Years of education: Not on file  . Highest education level: Not on file  Occupational History  . Not on file  Social Needs  . Financial resource strain: Not on file  .  Food insecurity:    Worry: Not on file    Inability: Not on file  . Transportation needs:    Medical: Not on file    Non-medical: Not on file  Tobacco Use  . Smoking status: Never Smoker  . Smokeless tobacco: Never Used  Substance and Sexual Activity  . Alcohol use: Yes    Alcohol/week: 8.0 standard drinks    Types: 8 Standard drinks or equivalent per week  . Drug use: No  . Sexual activity: Not Currently    Partners: Male    Birth control/protection: Post-menopausal  Lifestyle  . Physical activity:    Days per week: Not on file    Minutes per session: Not on  file  . Stress: Not on file  Relationships  . Social connections:    Talks on phone: Not on file    Gets together: Not on file    Attends religious service: Not on file    Active member of club or organization: Not on file    Attends meetings of clubs or organizations: Not on file    Relationship status: Not on file  . Intimate partner violence:    Fear of current or ex partner: Not on file    Emotionally abused: Not on file    Physically abused: Not on file    Forced sexual activity: Not on file  Other Topics Concern  . Not on file  Social History Narrative  . Not on file    Past Medical History, Surgical history, Social history, and Family history were reviewed and updated as appropriate.   Please see review of systems for further details on the patient's review from today.   Objective:   Physical Exam:  LMP 06/12/2012   Physical Exam Neurological:     Mental Status: She is alert and oriented to person, place, and time.     Cranial Nerves: No dysarthria.  Psychiatric:        Attention and Perception: Attention normal.        Mood and Affect: Mood normal.        Speech: Speech normal.        Behavior: Behavior is cooperative.        Thought Content: Thought content is not paranoid or delusional. Thought content does not include homicidal or suicidal ideation. Thought content does not include homicidal or suicidal plan.        Cognition and Memory: Cognition and memory normal.        Judgment: Judgment normal.     Comments: OCD managed.     Lab Review:  No results found for: NA, K, CL, CO2, GLUCOSE, BUN, CREATININE, CALCIUM, PROT, ALBUMIN, AST, ALT, ALKPHOS, BILITOT, GFRNONAA, GFRAA     Component Value Date/Time   HGB 12.9 06/28/2014 1324    No results found for: POCLITH, LITHIUM   No results found for: PHENYTOIN, PHENOBARB, VALPROATE, CBMZ   .res Assessment: Plan:    Mixed obsessional thoughts and acts  Depression, major, recurrent, in remission  [facility])  Patient has a very long greater than 25-year history of OCD and depression well managed on Luvox 150 mg daily.  She tolerates it well now.  History of sleepiness from it has resolved.  Symptoms have remained in remission and she is followed just on a yearly basis.  She is not having any unusual side effects.  She is aware of serotonin withdrawal.  Follow-up 1 year  Lynder Parents MD, DFAPA  Please see After Visit  Summary for patient specific instructions.  Future Appointments  Date Time Provider [place]  07/13/2018  1:00 PM Regina Eck, CNM [place] None    No orders of the defined types were placed in this encounter.     -------------------------------

## 2018-06-08 ENCOUNTER — Other Ambulatory Visit: Payer: Self-pay

## 2018-06-15 DIAGNOSIS — D2261 Melanocytic nevi of right upper limb, including shoulder: Secondary | ICD-10-CM | POA: Diagnosis not present

## 2018-06-15 DIAGNOSIS — D2262 Melanocytic nevi of left upper limb, including shoulder: Secondary | ICD-10-CM | POA: Diagnosis not present

## 2018-06-15 DIAGNOSIS — D2272 Melanocytic nevi of left lower limb, including hip: Secondary | ICD-10-CM | POA: Diagnosis not present

## 2018-06-15 DIAGNOSIS — D225 Melanocytic nevi of trunk: Secondary | ICD-10-CM | POA: Diagnosis not present

## 2018-06-16 DIAGNOSIS — S9031XA Contusion of right foot, initial encounter: Secondary | ICD-10-CM | POA: Diagnosis not present

## 2018-07-13 ENCOUNTER — Ambulatory Visit: Payer: BLUE CROSS/BLUE SHIELD | Admitting: Certified Nurse Midwife

## 2018-07-13 ENCOUNTER — Other Ambulatory Visit: Payer: Self-pay | Admitting: Certified Nurse Midwife

## 2018-07-13 DIAGNOSIS — E039 Hypothyroidism, unspecified: Secondary | ICD-10-CM

## 2018-07-13 NOTE — Telephone Encounter (Signed)
Medication refill request: Levothyroxine  Last AEX:  07/09/17 Next AEX: 07/19/18 Last MMG (if hormonal medication request): 07/21/17 Bi-rads 1 Neg  Refill authorized: #[id number] with 0 Rf to get her to her AEX

## 2018-07-19 ENCOUNTER — Ambulatory Visit: Payer: BLUE CROSS/BLUE SHIELD | Admitting: Certified Nurse Midwife

## 2018-08-14 ENCOUNTER — Other Ambulatory Visit: Payer: Self-pay | Admitting: Certified Nurse Midwife

## 2018-08-14 DIAGNOSIS — E039 Hypothyroidism, unspecified: Secondary | ICD-10-CM

## 2018-08-15 NOTE — Telephone Encounter (Signed)
Medication refill request: synthroid  Last AEX:  07/09/17 DL Next AEX: 10/18/18 DL Last MMG (if hormonal medication request): 07/21/17 BIRADS1:neg  Refill authorized: 07/13/18 #[id number]. Please advise.

## 2018-08-15 NOTE — Telephone Encounter (Signed)
Will refill one, but she needs to come in and have TSH drawn since it will be 10/2018 before she is seen. Order placed for TSH

## 2018-08-16 NOTE — Telephone Encounter (Signed)
Route to [employer]

## 2018-08-17 ENCOUNTER — Telehealth: Payer: Self-pay | Admitting: Certified Nurse Midwife

## 2018-08-17 ENCOUNTER — Other Ambulatory Visit: Payer: Self-pay | Admitting: Certified Nurse Midwife

## 2018-08-17 DIAGNOSIS — E559 Vitamin D deficiency, unspecified: Secondary | ICD-10-CM

## 2018-08-17 DIAGNOSIS — E038 Other specified hypothyroidism: Secondary | ICD-10-CM

## 2018-08-17 NOTE — Telephone Encounter (Signed)
Left message for patient to call to schedule lab appt. Please place order for labwork.

## 2018-08-17 NOTE — Telephone Encounter (Signed)
Per last year noted her PCP does all except TSH and Vitamin D so she will need those

## 2018-08-17 NOTE — Telephone Encounter (Signed)
Order placed

## 2018-08-17 NOTE — Telephone Encounter (Signed)
Spoke with patient. Patient was advised will need TSH labs prior to next refill of synthroid, see refill encounter dated 07/13/18. Patient is scheduled for AEX 10/18/18, patient is requesting all labs for AEX be done at same time, lab appt scheduled for 8/18 at 1:15pm.   Routing to Melvia Heaps, CNM to advise on AEX labs.

## 2018-08-17 NOTE — Telephone Encounter (Signed)
Lab appt is 08-30-2018

## 2018-08-17 NOTE — Telephone Encounter (Signed)
Her aex is 10/2018

## 2018-08-17 NOTE — Telephone Encounter (Signed)
Patient need to schedule appointment for thyroid labs. Sending to triage for orders.

## 2018-08-17 NOTE — Telephone Encounter (Signed)
Please cancel order for vitamin d unless you wanted that checked again.

## 2018-08-18 NOTE — Telephone Encounter (Signed)
Patient notified of labs.   Encounter closed.

## 2018-08-30 ENCOUNTER — Other Ambulatory Visit (INDEPENDENT_AMBULATORY_CARE_PROVIDER_SITE_OTHER): Payer: BC Managed Care – PPO

## 2018-08-30 ENCOUNTER — Other Ambulatory Visit: Payer: Self-pay

## 2018-08-30 DIAGNOSIS — E559 Vitamin D deficiency, unspecified: Secondary | ICD-10-CM | POA: Diagnosis not present

## 2018-08-30 DIAGNOSIS — E038 Other specified hypothyroidism: Secondary | ICD-10-CM

## 2018-09-01 ENCOUNTER — Other Ambulatory Visit: Payer: Self-pay | Admitting: Certified Nurse Midwife

## 2018-09-01 DIAGNOSIS — E039 Hypothyroidism, unspecified: Secondary | ICD-10-CM

## 2018-09-01 LAB — TSH: TSH: 1.6 u[IU]/mL (ref 0.450–4.500)

## 2018-09-01 LAB — VITAMIN D 25 HYDROXY (VIT D DEFICIENCY, FRACTURES): Vit D, 25-Hydroxy: 43.5 ng/mL (ref 30.0–100.0)

## 2018-09-01 MED ORDER — LEVOTHYROXINE SODIUM 50 MCG PO TABS: 50.0000 ug | ORAL_TABLET | Freq: Every day | ORAL | 11 refills | Status: DC

## 2018-09-16 ENCOUNTER — Other Ambulatory Visit: Payer: Self-pay | Admitting: Certified Nurse Midwife

## 2018-09-16 DIAGNOSIS — E039 Hypothyroidism, unspecified: Secondary | ICD-10-CM

## 2018-10-10 DIAGNOSIS — Z23 Encounter for immunization: Secondary | ICD-10-CM | POA: Diagnosis not present

## 2018-10-14 ENCOUNTER — Other Ambulatory Visit: Payer: Self-pay

## 2018-10-17 ENCOUNTER — Other Ambulatory Visit: Payer: Self-pay | Admitting: Certified Nurse Midwife

## 2018-10-17 DIAGNOSIS — Z1231 Encounter for screening mammogram for malignant neoplasm of breast: Secondary | ICD-10-CM

## 2018-10-18 ENCOUNTER — Other Ambulatory Visit: Payer: Self-pay

## 2018-10-18 ENCOUNTER — Ambulatory Visit (INDEPENDENT_AMBULATORY_CARE_PROVIDER_SITE_OTHER): Payer: BC Managed Care – PPO | Admitting: Certified Nurse Midwife

## 2018-10-18 ENCOUNTER — Encounter: Payer: Self-pay | Admitting: Certified Nurse Midwife

## 2018-10-18 VITALS — BP 106/64 | HR 64 | Temp 97.0°F | Resp 16 | Ht 61.75 in | Wt 133.0 lb

## 2018-10-18 DIAGNOSIS — Z01419 Encounter for gynecological examination (general) (routine) without abnormal findings: Secondary | ICD-10-CM | POA: Diagnosis not present

## 2018-10-18 DIAGNOSIS — N951 Menopausal and female climacteric states: Secondary | ICD-10-CM | POA: Diagnosis not present

## 2018-10-18 NOTE — Progress Notes (Signed)
63 y.o. G32P2003 Married  Caucasian Fe here for annual exam. Post menopausal no vaginal bleeding or vaginal dryness. Sees PCP Dr. Inda Merlin for aex and labs. Hypothyroid and Vitamin D are stable with medication. Now back to walking for exercise, feel so much better. No health issues today.  Patient's last menstrual period was 06/12/2012.          Sexually active: Yes.    The current method of family planning is post menopausal status.    Exercising: Yes.    biking & walking Smoker:  no  Review of Systems  Constitutional: Negative.   HENT: Negative.   Eyes: Negative.   Respiratory: Negative.   Cardiovascular: Negative.   Gastrointestinal: Negative.   Genitourinary: Negative.   Musculoskeletal: Negative.   Skin: Negative.   Neurological: Negative.   Endo/Heme/Allergies: Negative.   Psychiatric/Behavioral: Negative.     Health Maintenance: Pap:  07-03-15 neg HPV HR neg, 07-09-17 neg HPV HR neg History of Abnormal Pap: yes, scheduled for 10-26-18 MMG:  07-21-17 category c density birads 1:neg scheduled Self Breast exams: yes Colonoscopy: 2016 polyps f/u 81yrs BMD:  none TDaP:  2017 Shingles: no Pneumonia: no Hep C and HIV: not done Labs: with PCP Flu: yes has taken   reports that she has never smoked. She has never used smokeless tobacco. She reports current alcohol use of about 8.0 standard drinks of alcohol per week. She reports that she does not use drugs.  Past Medical History:  Diagnosis Date  . Abnormal Pap smear of cervix    age 67  . Breast mass 07/06/2016   Right breast lump felt by DR   . Depression   . MVP (mitral valve prolapse)   . Thyroid disease    hypothyroidism    Past Surgical History:  Procedure Laterality Date  . CESAREAN SECTION    . COLPOSCOPY     [name] maybe age 51    Current Outpatient Medications  Medication Sig Dispense Refill  . fluvoxaMINE (LUVOX) 50 MG tablet Take 3 tablets (150 mg total) by mouth at bedtime. 270 tablet 3  . levothyroxine  (SYNTHROID) 50 MCG tablet Take 1 tablet (50 mcg total) by mouth daily before breakfast. 30 tablet 11  . Multiple Vitamin (MULTIVITAMIN) tablet Take 1 tablet by mouth daily.    Marland Kitchen VITAMIN D PO Take 1,000 Int'l Units by mouth daily.     No current facility-administered medications for this visit.     Family History  Problem Relation Age of Onset  . Hypertension Mother   . Breast cancer Mother   . Diabetes Father   . Colon cancer Neg Hx   . Stomach cancer Neg Hx     ROS:  Pertinent items are noted in HPI.  Otherwise, a comprehensive ROS was negative.  Exam:   BP 106/64   Pulse 64   Temp (!) 97 F (36.1 C) (Skin)   Resp 16   Ht 5' 1.75" (1.568 m)   Wt 133 lb (60.3 kg)   LMP 06/12/2012   BMI 24.52 kg/m  Height: 5' 1.75" (156.8 cm) Ht Readings from Last 3 Encounters:  10/18/18 5' 1.75" (1.568 m)  07/09/17 5' 2.5" (1.588 m)  12/04/16 5\' 3"  (1.6 m)    General appearance: alert, cooperative and appears stated age Head: Normocephalic, without obvious abnormality, atraumatic Neck: no adenopathy, supple, symmetrical, trachea midline and thyroid normal to inspection and palpation Lungs: clear to auscultation bilaterally Breasts: normal appearance, no masses or tenderness, No nipple retraction  or dimpling, No nipple discharge or bleeding, No axillary or supraclavicular adenopathy Heart: regular rate and rhythm Abdomen: soft, non-tender; no masses,  no organomegaly Extremities: extremities normal, atraumatic, no cyanosis or edema Skin: Skin color, texture, turgor normal. No rashes or lesions Lymph nodes: Cervical, supraclavicular, and axillary nodes normal. No abnormal inguinal nodes palpated Neurologic: Grossly normal   Pelvic: External genitalia:  no lesions              Urethra:  normal appearing urethra with no masses, tenderness or lesions              Bartholin's and Skene's: normal                 Vagina: normal appearing vagina with normal color and discharge, no lesions,  very slight dryness around introitus only              Cervix: no cervical motion tenderness, no lesions and normal appearance              Pap taken: No. Bimanual Exam:  Uterus:  normal size, contour, position, consistency, mobility, non-tender and mid position              Adnexa: normal adnexa and no mass, fullness, tenderness               Rectovaginal: Confirms               Anus:  normal sphincter tone, no lesions  Chaperone present: yes  A:  Well Woman with normal exam  Menopausal no HRT.  Slight vaginal dryness around introital area only.  PCP management of labs, Vitamin D and Hypothyroid  OCD management with MD  Family history of Breast cancer mother BRACA unknown  P:   Reviewed health and wellness pertinent to exam  Aware of need to advise if vaginal bleeding.  Discussed dryness around introitus and increase risk of UTI. Suggested coconut oil/Olive oil use for moisture and protection..  Continue follow up with MD's as indicated for health issues.  Stressed SBE and mammogram yearly.  Pap smear: no   counseled on breast self exam, mammography screening, feminine hygiene, adequate intake of calcium and vitamin D, diet and exercise  return annually or prn  An After Visit Summary was printed and given to the patient.

## 2018-10-18 NOTE — Patient Instructions (Signed)

## 2018-10-26 ENCOUNTER — Ambulatory Visit
Admission: RE | Admit: 2018-10-26 | Discharge: 2018-10-26 | Disposition: A | Discharge status: Home / Self Care | Payer: BC Managed Care – PPO | Source: Ambulatory Visit | Attending: Certified Nurse Midwife | Admitting: Certified Nurse Midwife

## 2018-10-26 ENCOUNTER — Other Ambulatory Visit: Payer: Self-pay

## 2018-10-26 DIAGNOSIS — Z1231 Encounter for screening mammogram for malignant neoplasm of breast: Secondary | ICD-10-CM

## 2018-12-03 ENCOUNTER — Other Ambulatory Visit: Payer: Self-pay

## 2018-12-03 DIAGNOSIS — Z20822 Contact with and (suspected) exposure to covid-19: Secondary | ICD-10-CM

## 2018-12-05 LAB — NOVEL CORONAVIRUS, NAA: SARS-CoV-2, NAA: NOT DETECTED

## 2019-02-01 DIAGNOSIS — H43813 Vitreous degeneration, bilateral: Secondary | ICD-10-CM | POA: Diagnosis not present

## 2019-02-01 DIAGNOSIS — H524 Presbyopia: Secondary | ICD-10-CM | POA: Diagnosis not present

## 2019-02-01 DIAGNOSIS — H25042 Posterior subcapsular polar age-related cataract, left eye: Secondary | ICD-10-CM | POA: Diagnosis not present

## 2019-02-01 DIAGNOSIS — H2513 Age-related nuclear cataract, bilateral: Secondary | ICD-10-CM | POA: Diagnosis not present

## 2019-04-04 ENCOUNTER — Encounter: Payer: Self-pay | Admitting: Certified Nurse Midwife

## 2019-05-04 ENCOUNTER — Encounter: Payer: Self-pay | Admitting: Gastroenterology

## 2019-05-08 DIAGNOSIS — F329 Major depressive disorder, single episode, unspecified: Secondary | ICD-10-CM | POA: Diagnosis not present

## 2019-05-08 DIAGNOSIS — Z0000 Encounter for general adult medical examination without abnormal findings: Secondary | ICD-10-CM | POA: Diagnosis not present

## 2019-05-08 DIAGNOSIS — E039 Hypothyroidism, unspecified: Secondary | ICD-10-CM | POA: Diagnosis not present

## 2019-05-08 DIAGNOSIS — E559 Vitamin D deficiency, unspecified: Secondary | ICD-10-CM | POA: Diagnosis not present

## 2019-05-08 DIAGNOSIS — Z79899 Other long term (current) drug therapy: Secondary | ICD-10-CM | POA: Diagnosis not present

## 2019-05-08 DIAGNOSIS — F429 Obsessive-compulsive disorder, unspecified: Secondary | ICD-10-CM | POA: Diagnosis not present

## 2019-05-08 DIAGNOSIS — Z13220 Encounter for screening for lipoid disorders: Secondary | ICD-10-CM | POA: Diagnosis not present

## 2019-05-31 ENCOUNTER — Ambulatory Visit (AMBULATORY_SURGERY_CENTER): Payer: Self-pay | Admitting: *Deleted

## 2019-05-31 ENCOUNTER — Other Ambulatory Visit: Payer: Self-pay

## 2019-05-31 VITALS — Ht 61.75 in | Wt 131.0 lb

## 2019-05-31 DIAGNOSIS — Z86010 Personal history of colonic polyps: Secondary | ICD-10-CM

## 2019-05-31 MED ORDER — NA SULFATE-K SULFATE-MG SULF 17.5-3.13-1.6 GM/177ML PO SOLN: 1.0000 | Freq: Once | ORAL | 0 refills | Status: AC

## 2019-05-31 NOTE — Progress Notes (Signed)

## 2019-06-14 ENCOUNTER — Encounter: Payer: Self-pay | Admitting: Gastroenterology

## 2019-06-21 ENCOUNTER — Other Ambulatory Visit: Payer: Self-pay

## 2019-06-21 ENCOUNTER — Ambulatory Visit (AMBULATORY_SURGERY_CENTER): Payer: BC Managed Care – PPO | Admitting: Gastroenterology

## 2019-06-21 ENCOUNTER — Encounter: Payer: Self-pay | Admitting: Gastroenterology

## 2019-06-21 VITALS — BP 127/77 | HR 77 | Temp 96.9°F | Resp 14 | Ht 61.0 in | Wt 131.0 lb

## 2019-06-21 DIAGNOSIS — Z86010 Personal history of colonic polyps: Secondary | ICD-10-CM

## 2019-06-21 DIAGNOSIS — D127 Benign neoplasm of rectosigmoid junction: Secondary | ICD-10-CM | POA: Diagnosis not present

## 2019-06-21 DIAGNOSIS — D125 Benign neoplasm of sigmoid colon: Secondary | ICD-10-CM

## 2019-06-21 DIAGNOSIS — D122 Benign neoplasm of ascending colon: Secondary | ICD-10-CM | POA: Diagnosis not present

## 2019-06-21 DIAGNOSIS — D128 Benign neoplasm of rectum: Secondary | ICD-10-CM

## 2019-06-21 DIAGNOSIS — D123 Benign neoplasm of transverse colon: Secondary | ICD-10-CM

## 2019-06-21 DIAGNOSIS — Z1211 Encounter for screening for malignant neoplasm of colon: Secondary | ICD-10-CM | POA: Diagnosis not present

## 2019-06-21 MED ORDER — SODIUM CHLORIDE 0.9 % IV SOLN: 500.0000 mL | Freq: Once | INTRAVENOUS | Status: DC

## 2019-06-21 NOTE — Progress Notes (Signed)
Called to room to assist during endoscopic procedure.  Patient ID and intended procedure confirmed with present staff. Received instructions for my participation in the procedure from the performing physician.  

## 2019-06-21 NOTE — Op Note (Signed)
[place] Patient Name: Nicole Singleton Procedure Date: 06/21/2019 7:44 AM MRN: [id number] Endoscopist: Thornton Park MD, MD Age: 64 Referring MD:  Date of Birth: 1955-12-02 Gender: Female Account #: [id number] Procedure:                Colonoscopy Indications:              Surveillance: Personal history of adenomatous                            polyps on last colonoscopy 5 years ago                           Polyp on colonoscopy in 2010 - not retrieved                           Tubular adenoma on colonoscopy 2016 with Dr. Olevia Perches                           No known family history of colon cancer or polyps Medicines:                Monitored Anesthesia Care Procedure:                Pre-Anesthesia Assessment:                           - Prior to the procedure, a History and Physical                            was performed, and patient medications and                            allergies were reviewed. The patient's tolerance of                            previous anesthesia was also reviewed. The risks                            and benefits of the procedure and the sedation                            options and risks were discussed with the patient.                            All questions were answered, and informed consent                            was obtained. Prior Anticoagulants: The patient has                            taken no previous anticoagulant or antiplatelet                            agents. ASA Grade Assessment: II - A patient with  mild systemic disease. After reviewing the risks                            and benefits, the patient was deemed in                            satisfactory condition to undergo the procedure.                           After obtaining informed consent, the colonoscope                            was passed under direct vision. Throughout the                            procedure, the patient's blood  pressure, pulse, and                            oxygen saturations were monitored continuously. The                            Colonoscope was introduced through the anus and                            advanced to the 3 cm into the ileum. A second                            forward view of the right colon was performed. The                            colonoscopy was performed without difficulty. The                            patient tolerated the procedure well. The quality                            of the bowel preparation was good. The terminal                            ileum, ileocecal valve, appendiceal orifice, and                            rectum were photographed. Scope In: 8:05:58 AM Scope Out: 8:22:32 AM Scope Withdrawal Time: 0 hours 13 minutes 39 seconds  Total Procedure Duration: 0 hours 16 minutes 34 seconds  Findings:                 The perianal and digital rectal examinations were                            normal.                           Two flat polyps were found in the rectum. The  polyps were less than 1 mm in size. These polyps                            were removed with a cold biopsy forceps. Resection                            and retrieval were complete. Estimated blood loss                            was minimal.                           A 2 mm polyp was found in the sigmoid colon. The                            polyp was sessile. The polyp was removed with a                            cold snare. Resection and retrieval were complete.                            Estimated blood loss was minimal.                           A 2 mm polyp was found in the hepatic flexure. The                            polyp was sessile. The polyp was removed with a                            cold snare. Resection and retrieval were complete.                            Estimated blood loss was minimal.                           Two flat polyps were  found in the hepatic flexure.                            The polyps were less than 1 mm in size. These                            polyps were removed with a cold biopsy forceps.                            Resection and retrieval were complete. Estimated                            blood loss was minimal.                           A 3 mm polyp was found in the ascending colon. The  polyp was sessile. The polyp was removed with a                            cold snare. Resection and retrieval were complete.                            Estimated blood loss was minimal.                           The exam was otherwise without abnormality on                            direct and retroflexion views. Complications:            No immediate complications. Estimated blood loss:                            Minimal. Estimated Blood Loss:     Estimated blood loss was minimal. Impression:               - Two less than 1 mm polyps in the rectum, removed                            with a cold biopsy forceps. Resected and retrieved.                           - One 2 mm polyp in the sigmoid colon, removed with                            a cold snare. Resected and retrieved.                           - One 2 mm polyp at the hepatic flexure, removed                            with a cold snare. Resected and retrieved.                           - Two less than 1 mm polyps at the hepatic flexure,                            removed with a cold biopsy forceps. Resected and                            retrieved.                           - One 3 mm polyp in the ascending colon, removed                            with a cold snare. Resected and retrieved.                           - The examination was otherwise normal  on direct                            and retroflexion views. Recommendation:           - Patient has a contact number available for                            emergencies. The  signs and symptoms of potential                            delayed complications were discussed with the                            patient. Return to normal activities tomorrow.                            Written discharge instructions were provided to the                            patient.                           - Resume previous diet.                           - Continue present medications.                           - Await pathology results.                           - Repeat colonoscopy date to be determined after                            pending pathology results are reviewed for                            surveillance.                           - Emerging evidence supports eating a diet of                            fruits, vegetables, grains, calcium, and yogurt                            while reducing red meat and alcohol may reduce the                            risk of colon cancer.                           - Thank you for allowing me to be involved in your                            colon cancer prevention.  Thornton Park MD, MD 06/21/2019 8:34:05 AM This report has been signed electronically.

## 2019-06-21 NOTE — Patient Instructions (Addendum)
Please read all of the handouts given to you by your recovery room nurse.  YOU HAD AN ENDOSCOPIC PROCEDURE TODAY AT [facility]:   Refer to the procedure report that was given to you for any specific questions about what was found during the examination.  If the procedure report does not answer your questions, please call your gastroenterologist to clarify.  If you requested that your care partner not be given the details of your procedure findings, then the procedure report has been included in a sealed envelope for you to review at your convenience later.  YOU SHOULD EXPECT: Some feelings of bloating in the abdomen. Passage of more gas than usual.  Walking can help get rid of the air that was put into your GI tract during the procedure and reduce the bloating. If you had a lower endoscopy (such as a colonoscopy or flexible sigmoidoscopy) you may notice spotting of blood in your stool or on the toilet paper. If you underwent a bowel prep for your procedure, you may not have a normal bowel movement for a few days.  Please Note:  You might notice some irritation and congestion in your nose or some drainage.  This is from the oxygen used during your procedure.  There is no need for concern and it should clear up in a day or so.  SYMPTOMS TO REPORT IMMEDIATELY:   Following lower endoscopy (colonoscopy or flexible sigmoidoscopy):  Excessive amounts of blood in the stool  Significant tenderness or worsening of abdominal pains  Swelling of the abdomen that is new, acute  Fever of 100F or higher  For urgent or emergent issues, a gastroenterologist can be reached at any hour by calling [phone]. Do not use MyChart messaging for urgent concerns.    DIET:  We do recommend a small meal at first, but then you may proceed to your regular diet.  Drink plenty of fluids but you should avoid alcoholic beverages for 24 hours. Try to increase the fiber in your diet, and drink plenty of  water.  ACTIVITY:  You should plan to take it easy for the rest of today and you should NOT DRIVE or use heavy machinery until tomorrow (because of the sedation medicines used during the test).    FOLLOW UP: Our staff will call the number listed on your records 48-72 hours following your procedure to check on you and address any questions or concerns that you may have regarding the information given to you following your procedure. If we do not reach you, we will leave a message.  We will attempt to reach you two times.  During this call, we will ask if you have developed any symptoms of COVID 19. If you develop any symptoms (ie: fever, flu-like symptoms, shortness of breath, cough etc.) before then, please call [phone].  If you test positive for Covid 19 in the 2 weeks post procedure, please call and report this information to [place].    If any biopsies were taken you will be contacted by phone or by letter within the next 1-3 weeks.  Please call us at [phone] if you have not heard about the biopsies in 3 weeks.    SIGNATURES/CONFIDENTIALITY: You and/or your care partner have signed paperwork which will be entered into your electronic medical record.  These signatures attest to the fact that that the information above on your After Visit Summary has been reviewed and is understood.  Full responsibility of the confidentiality of  this discharge information lies with you and/or your care-partner.

## 2019-06-21 NOTE — Progress Notes (Signed)
pt tolerated well. VSS. awake and to recovery. Report given to RN.  

## 2019-06-21 NOTE — Progress Notes (Signed)
Pt's states no medical or surgical changes since previsit or office visit. 

## 2019-06-23 ENCOUNTER — Telehealth: Payer: Self-pay

## 2019-06-23 NOTE — Telephone Encounter (Signed)
°  Follow up Call-  Call back number 06/21/2019  Post procedure Call Back phone  # [phone]  Permission to leave phone message Yes  Some recent data might be hidden     Patient questions:  Do you have a fever, pain , or abdominal swelling? No. Pain Score  0 *  Have you tolerated food without any problems? Yes.    Have you been able to return to your normal activities? Yes.    Do you have any questions about your discharge instructions: Diet   No. Medications  No. Follow up visit  No.  Do you have questions or concerns about your Care? No.  Actions: * If pain score is 4 or above: No action needed, pain <4.  1. Have you developed a fever since your procedure? no  2.   Have you had an respiratory symptoms (SOB or cough) since your procedure? no  3.   Have you tested positive for COVID 19 since your procedure no  4.   Have you had any family members/close contacts diagnosed with the COVID 19 since your procedure?  no   If yes to any of these questions please route to Joylene John, RN and Erenest Rasher, RN

## 2019-06-26 ENCOUNTER — Encounter: Payer: Self-pay | Admitting: Gastroenterology

## 2019-06-27 ENCOUNTER — Other Ambulatory Visit: Payer: Self-pay | Admitting: Psychiatry

## 2019-08-03 DIAGNOSIS — M25562 Pain in left knee: Secondary | ICD-10-CM | POA: Diagnosis not present

## 2019-08-03 DIAGNOSIS — D2261 Melanocytic nevi of right upper limb, including shoulder: Secondary | ICD-10-CM | POA: Diagnosis not present

## 2019-08-03 DIAGNOSIS — D220 Melanocytic nevi of lip: Secondary | ICD-10-CM | POA: Diagnosis not present

## 2019-08-03 DIAGNOSIS — L918 Other hypertrophic disorders of the skin: Secondary | ICD-10-CM | POA: Diagnosis not present

## 2019-08-03 DIAGNOSIS — D2262 Melanocytic nevi of left upper limb, including shoulder: Secondary | ICD-10-CM | POA: Diagnosis not present

## 2019-09-06 ENCOUNTER — Other Ambulatory Visit: Payer: Self-pay

## 2019-09-06 ENCOUNTER — Ambulatory Visit (INDEPENDENT_AMBULATORY_CARE_PROVIDER_SITE_OTHER): Payer: BC Managed Care – PPO | Admitting: Psychiatry

## 2019-09-06 ENCOUNTER — Encounter: Payer: Self-pay | Admitting: Psychiatry

## 2019-09-06 DIAGNOSIS — F422 Mixed obsessional thoughts and acts: Secondary | ICD-10-CM | POA: Diagnosis not present

## 2019-09-06 DIAGNOSIS — F3340 Major depressive disorder, recurrent, in remission, unspecified: Secondary | ICD-10-CM

## 2019-09-06 MED ORDER — FLUVOXAMINE MALEATE 50 MG PO TABS: 150.0000 mg | ORAL_TABLET | Freq: Every day | ORAL | 3 refills | Status: DC

## 2019-09-06 NOTE — Progress Notes (Signed)
Nicole Singleton [id number] August 06, 1955 64 y.o.    Subjective:   Patient ID:  Nicole Singleton is a 64 y.o. (DOB July 20, 1955) female.  Chief Complaint:  Chief Complaint  Patient presents with  . Follow-up    Medication Management    HPI Nicole Singleton FU OCD and depression.  Last seen 05/2018.  No meds changed.  Remains on 150 mg of Luvox.  Covid free.  Vaccinated.  No unusual levels of anxiety.  Concern for pregnant daughter and little kids.  D is very stressed out and so pt does not do anything socially out of fear of Covid.  On Luvox for 64 yo.  Tolerating it well.  Still doing well.  OCD mainly was checking and is managed.  No history of obs health fears.  Managing well.  Patient reports stable mood and denies depressed or irritable moods.  Patient denies any recent difficulty with anxiety.  Patient denies difficulty with sleep initiation or maintenance. Denies appetite disturbance.  Patient reports that energy and motivation have been good.  Patient denies any difficulty with concentration.  Patient denies any suicidal ideation.  She drinks little caffeine. Has cabin in the [place].    Review of Systems:  Review of Systems  Cardiovascular: Negative for palpitations.  Neurological: Negative for tremors and weakness.    Medications: I have reviewed the patient's current medications.  Current Outpatient Medications  Medication Sig Dispense Refill  . fluvoxaMINE (LUVOX) 50 MG tablet TAKE 3 TABLETS(150 MG) BY MOUTH AT BEDTIME 270 tablet 0  . levothyroxine (SYNTHROID) 50 MCG tablet Take 1 tablet (50 mcg total) by mouth daily before breakfast. 30 tablet 11  . Multiple Vitamin (MULTIVITAMIN) tablet Take 1 tablet by mouth daily.    Marland Kitchen VITAMIN D PO Take 1,000 Int'l Units by mouth daily.     No current facility-administered medications for this visit.    Medication Side Effects: None  No sleepiness anyhmore. Allergies: No Known Allergies  Past Medical History:  Diagnosis  Date  . Abnormal Pap smear of cervix    age 57  . Allergy    seasonal  . Anemia   . Anxiety   . Arthritis   . Breast mass 07/06/2016   Right breast lump felt by DR, benign  . Cataract   . Depression   . Heart murmur   . MVP (mitral valve prolapse)   . Thyroid disease    hypothyroidism    Family History  Problem Relation Age of Onset  . Hypertension Mother   . Breast cancer Mother   . Diabetes Father   . Colon cancer Neg Hx   . Stomach cancer Neg Hx   . Esophageal cancer Neg Hx   . Colon polyps Neg Hx     Social History   Socioeconomic History  . Marital status: Married    Spouse name: Not on file  . Number of children: Not on file  . Years of education: Not on file  . Highest education level: Not on file  Occupational History  . Not on file  Tobacco Use  . Smoking status: Never Smoker  . Smokeless tobacco: Never Used  Substance and Sexual Activity  . Alcohol use: Yes    Alcohol/week: 10.0 standard drinks    Types: 10 Glasses of wine per week  . Drug use: No  . Sexual activity: Not Currently    Partners: Male    Birth control/protection: Post-menopausal  Other Topics Concern  . Not on file  Social History Narrative  . Not on file   Social Determinants of Health   Financial Resource Strain:   . Difficulty of Paying Living Expenses: Not on file  Food Insecurity:   . Worried About Charity fundraiser in the Last Year: Not on file  . Ran Out of Food in the Last Year: Not on file  Transportation Needs:   . Lack of Transportation (Medical): Not on file  . Lack of Transportation (Non-Medical): Not on file  Physical Activity:   . Days of Exercise per Week: Not on file  . Minutes of Exercise per Session: Not on file  Stress:   . Feeling of Stress : Not on file  Social Connections:   . Frequency of Communication with Friends and Family: Not on file  . Frequency of Social Gatherings with Friends and Family: Not on file  . Attends Religious Services: Not on  file  . Active Member of Clubs or Organizations: Not on file  . Attends Archivist Meetings: Not on file  . Marital Status: Not on file  Intimate Partner Violence:   . Fear of Current or Ex-Partner: Not on file  . Emotionally Abused: Not on file  . Physically Abused: Not on file  . Sexually Abused: Not on file    Past Medical History, Surgical history, Social history, and Family history were reviewed and updated as appropriate.   Please see review of systems for further details on the patient's review from today.   Objective:   Physical Exam:  LMP 06/12/2012   Physical Exam Neurological:     Mental Status: She is alert and oriented to person, place, and time.     Cranial Nerves: No dysarthria.  Psychiatric:        Attention and Perception: Attention normal.        Mood and Affect: Mood is anxious.        Speech: Speech normal.        Behavior: Behavior is cooperative.        Thought Content: Thought content is not paranoid or delusional. Thought content does not include homicidal or suicidal ideation. Thought content does not include homicidal or suicidal plan.        Cognition and Memory: Cognition and memory normal.        Judgment: Judgment normal.     Comments: OCD managed.     Lab Review:  No results found for: NA, K, CL, CO2, GLUCOSE, BUN, CREATININE, CALCIUM, PROT, ALBUMIN, AST, ALT, ALKPHOS, BILITOT, GFRNONAA, GFRAA     Component Value Date/Time   HGB 12.9 06/28/2014 1324    No results found for: POCLITH, LITHIUM   No results found for: PHENYTOIN, PHENOBARB, VALPROATE, CBMZ   .res Assessment: Plan:    Mixed obsessional thoughts and acts  Depression, major, recurrent, in remission [facility])  Patient has a very long greater than 25-year history of OCD and depression well managed on Luvox 150 mg daily.  She tolerates it well now.  History of sleepiness from it has resolved.  Symptoms have remained in remission and she is followed just on a yearly  basis.  She is not having any unusual side effects.  She is aware of serotonin withdrawal.  Disc DDI with caffeine.  Follow-up 1 year  Lynder Parents MD, DFAPA  Please see After Visit Summary for patient specific instructions.  No future appointments.  No orders of the defined types were placed in this encounter.     -------------------------------

## 2019-09-15 ENCOUNTER — Other Ambulatory Visit: Payer: Self-pay | Admitting: Obstetrics and Gynecology

## 2019-09-15 DIAGNOSIS — E039 Hypothyroidism, unspecified: Secondary | ICD-10-CM

## 2019-09-15 MED ORDER — LEVOTHYROXINE SODIUM 50 MCG PO TABS: 50.0000 ug | ORAL_TABLET | Freq: Every day | ORAL | 1 refills | Status: DC

## 2019-09-15 NOTE — Telephone Encounter (Signed)
Patient checking on refill request from pharmacy.

## 2019-09-15 NOTE — Telephone Encounter (Signed)
Medication refill request: Synthroid  Last AEX:  10-18-2018 DL  Next AEX: 11-01-19 Last MMG (if hormonal medication request): n/a Refill authorized: Today, please advise.   Call to patient. Patient requesting refill for synthroid. Patient states she has 4 pills left. Pharmacy confirmed as [facility]. RN advised would send request to Dr. Quincy Simmonds. Patient verbalized understanding and agreeable.   Medication pended for #30,1RF. Please refill if appropriate.

## 2019-10-17 ENCOUNTER — Other Ambulatory Visit: Payer: Self-pay | Admitting: Obstetrics and Gynecology

## 2019-10-17 DIAGNOSIS — Z1231 Encounter for screening mammogram for malignant neoplasm of breast: Secondary | ICD-10-CM

## 2019-10-20 ENCOUNTER — Ambulatory Visit: Payer: Self-pay | Admitting: Certified Nurse Midwife

## 2019-10-31 NOTE — Progress Notes (Signed)
64 y.o. G23P2003 Married Caucasian female here for annual exam.    Needs Synthroid medication and testing.  No problems with managing this.  PCP manages the rest of her blood work.   Denies vaginal bleeding or spotting.   Has three daughters.  One grandchild and another on the way.   Completed Covid vaccine.  Flu vaccine completed.   Mother passed away from CHF.   PCP:   Josetta Huddle, MD  Patient's last menstrual period was 06/12/2012.           Sexually active: No.  The current method of family planning is post menopausal status.    Exercising: Yes.    biking, hiking Smoker:  no  Health Maintenance: Pap: 07-09-17 Neg:Neg HR HPV, 07-03-15 Neg:Neg HR HPV, 06-23-13 Neg History of abnormal Pap:  ?abnormal pap age 45 MMG: 10-26-18 3D/Neg/density C/BiRads1--appt. 11-07-19 Colonoscopy: 06-21-19 multiple polyps-tubular adenomas and hyperplastic;next due 3 years BMD:   no Result  n/a TDaP:  2017 Gardasil:   no HIV: no  Hep C: no Screening Labs:  Hb today: PCP, Urine today: not collected    reports that she has never smoked. She has never used smokeless tobacco. She reports current alcohol use of about 10.0 standard drinks of alcohol per week. She reports that she does not use drugs.  Past Medical History:  Diagnosis Date  . Abnormal Pap smear of cervix    age 57  . Allergy    seasonal  . Anemia   . Anxiety   . Arthritis   . Breast mass 07/06/2016   Right breast lump felt by DR, benign  . Cataract   . Depression   . Heart murmur   . MVP (mitral valve prolapse)   . Thyroid disease    hypothyroidism    Past Surgical History:  Procedure Laterality Date  . CESAREAN SECTION    . COLONOSCOPY    . COLPOSCOPY     [name] maybe age 84    Current Outpatient Medications  Medication Sig Dispense Refill  . fluvoxaMINE (LUVOX) 50 MG tablet Take 3 tablets (150 mg total) by mouth at bedtime. 270 tablet 3  . levothyroxine (SYNTHROID) 50 MCG tablet Take 1 tablet (50 mcg total) by  mouth daily before breakfast. 30 tablet 1  . Multiple Vitamin (MULTIVITAMIN) tablet Take 1 tablet by mouth daily.    . Psyllium (METAMUCIL PO) Take by mouth.    Marland Kitchen VITAMIN D PO Take 1,000 Int'l Units by mouth daily.     No current facility-administered medications for this visit.    Family History  Problem Relation Age of Onset  . Hypertension Mother   . Breast cancer Mother   . Diabetes Father   . Colon cancer Neg Hx   . Stomach cancer Neg Hx   . Esophageal cancer Neg Hx   . Colon polyps Neg Hx     Review of Systems  All other systems reviewed and are negative.   Exam:   BP 138/78 (BP Location: Right Arm, Patient Position: Sitting, Cuff Size: Normal)   Pulse 92   Resp 14   Ht 5' 2.5" (1.588 m)   Wt 134 lb (60.8 kg)   LMP 06/12/2012   BMI 24.12 kg/m     General appearance: alert, cooperative and appears stated age Head: normocephalic, without obvious abnormality, atraumatic Neck: no adenopathy, supple, symmetrical, trachea midline and thyroid normal to inspection and palpation Lungs: clear to auscultation bilaterally Breasts: normal appearance, no masses or tenderness, No  nipple retraction or dimpling, No nipple discharge or bleeding, No axillary adenopathy Heart: regular rate and rhythm Abdomen: soft, non-tender; no masses, no organomegaly Extremities: extremities normal, atraumatic, no cyanosis or edema Skin: skin color, texture, turgor normal. No rashes or lesions Lymph nodes: cervical, supraclavicular, and axillary nodes normal. Neurologic: grossly normal  Pelvic: External genitalia:  no lesions              No abnormal inguinal nodes palpated.              Urethra:  normal appearing urethra with no masses, tenderness or lesions              Bartholins and Skenes: normal                 Vagina: normal appearing vagina with normal color and discharge, no lesions              Cervix: no lesions              Pap taken: No. Bimanual Exam:  Uterus:  normal size,  contour, position, consistency, mobility, non-tender              Adnexa: no mass, fullness, tenderness              Rectal exam: Yes.  .  Confirms.              Anus:  normal sphincter tone, no lesions  Chaperone was present for exam.  Assessment:   Well woman visit with normal exam. Hypothyroidism. FH breast cancer.  BRCA status not known.  Plan: Mammogram screening discussed. Self breast awareness reviewed. Pap and HR HPV 2024.  Guidelines for Calcium, Vitamin D, regular exercise program including cardiovascular and weight bearing exercise. TFTs.  Refill of Synthroid for one year.  Follow up annually and prn.

## 2019-11-01 ENCOUNTER — Ambulatory Visit (INDEPENDENT_AMBULATORY_CARE_PROVIDER_SITE_OTHER): Payer: BC Managed Care – PPO | Admitting: Obstetrics and Gynecology

## 2019-11-01 ENCOUNTER — Encounter: Payer: Self-pay | Admitting: Obstetrics and Gynecology

## 2019-11-01 ENCOUNTER — Other Ambulatory Visit: Payer: Self-pay

## 2019-11-01 VITALS — BP 138/78 | HR 92 | Resp 14 | Ht 62.5 in | Wt 134.0 lb

## 2019-11-01 DIAGNOSIS — E039 Hypothyroidism, unspecified: Secondary | ICD-10-CM

## 2019-11-01 DIAGNOSIS — Z01419 Encounter for gynecological examination (general) (routine) without abnormal findings: Secondary | ICD-10-CM | POA: Diagnosis not present

## 2019-11-01 MED ORDER — LEVOTHYROXINE SODIUM 50 MCG PO TABS: 50.0000 ug | ORAL_TABLET | Freq: Every day | ORAL | 3 refills | Status: DC

## 2019-11-01 NOTE — Patient Instructions (Signed)

## 2019-11-02 LAB — TSH: TSH: 2.48 u[IU]/mL (ref 0.450–4.500)

## 2019-11-02 LAB — T4, FREE: Free T4: 1.19 ng/dL (ref 0.82–1.77)

## 2019-11-07 ENCOUNTER — Ambulatory Visit
Admission: RE | Admit: 2019-11-07 | Discharge: 2019-11-07 | Disposition: A | Discharge status: Home / Self Care | Payer: BC Managed Care – PPO | Source: Ambulatory Visit | Attending: Obstetrics and Gynecology | Admitting: Obstetrics and Gynecology

## 2019-11-07 ENCOUNTER — Other Ambulatory Visit: Payer: Self-pay

## 2019-11-07 DIAGNOSIS — Z1231 Encounter for screening mammogram for malignant neoplasm of breast: Secondary | ICD-10-CM | POA: Diagnosis not present

## 2019-11-15 ENCOUNTER — Other Ambulatory Visit: Payer: Self-pay | Admitting: Obstetrics and Gynecology

## 2019-11-15 DIAGNOSIS — E039 Hypothyroidism, unspecified: Secondary | ICD-10-CM

## 2020-02-06 DIAGNOSIS — H524 Presbyopia: Secondary | ICD-10-CM | POA: Diagnosis not present

## 2020-02-06 DIAGNOSIS — H2513 Age-related nuclear cataract, bilateral: Secondary | ICD-10-CM | POA: Diagnosis not present

## 2020-02-06 DIAGNOSIS — H25042 Posterior subcapsular polar age-related cataract, left eye: Secondary | ICD-10-CM | POA: Diagnosis not present

## 2020-02-06 DIAGNOSIS — H1789 Other corneal scars and opacities: Secondary | ICD-10-CM | POA: Diagnosis not present

## 2020-03-07 DIAGNOSIS — M7989 Other specified soft tissue disorders: Secondary | ICD-10-CM | POA: Diagnosis not present

## 2020-03-07 DIAGNOSIS — Z6823 Body mass index (BMI) 23.0-23.9, adult: Secondary | ICD-10-CM | POA: Diagnosis not present

## 2020-04-25 DIAGNOSIS — Z20822 Contact with and (suspected) exposure to covid-19: Secondary | ICD-10-CM | POA: Diagnosis not present

## 2020-07-02 IMAGING — MG DIGITAL SCREENING BILAT W/ TOMO W/ CAD
8 series · 8 of 24 positions shown · non-contrast
Comparison: Previous exam(s).

CLINICAL DATA: Screening.

EXAM:
DIGITAL SCREENING BILATERAL MAMMOGRAM WITH TOMO AND CAD

[R CC synth-2D]
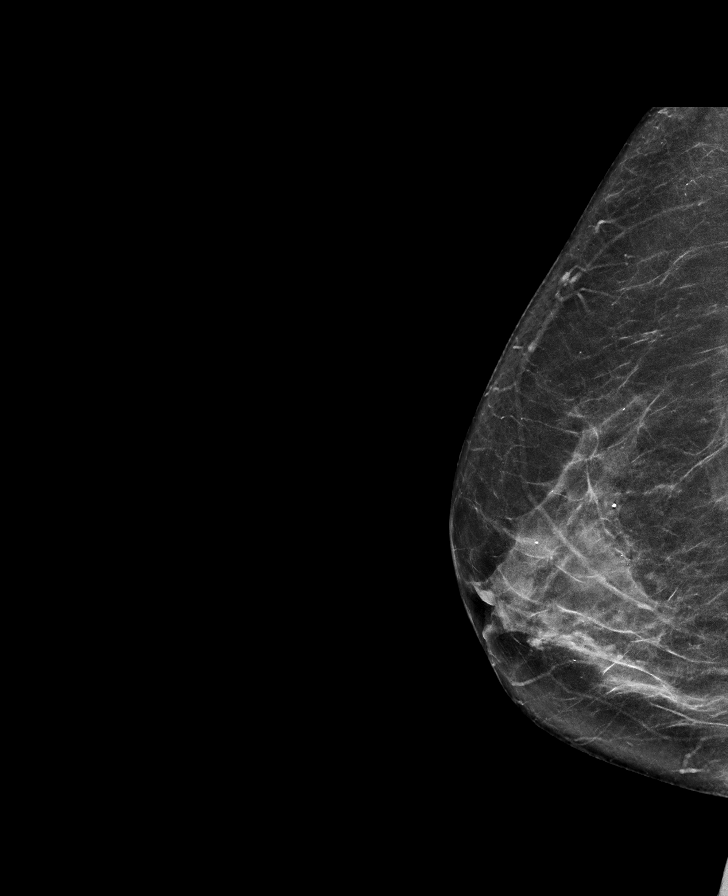

[R MLO synth-2D]
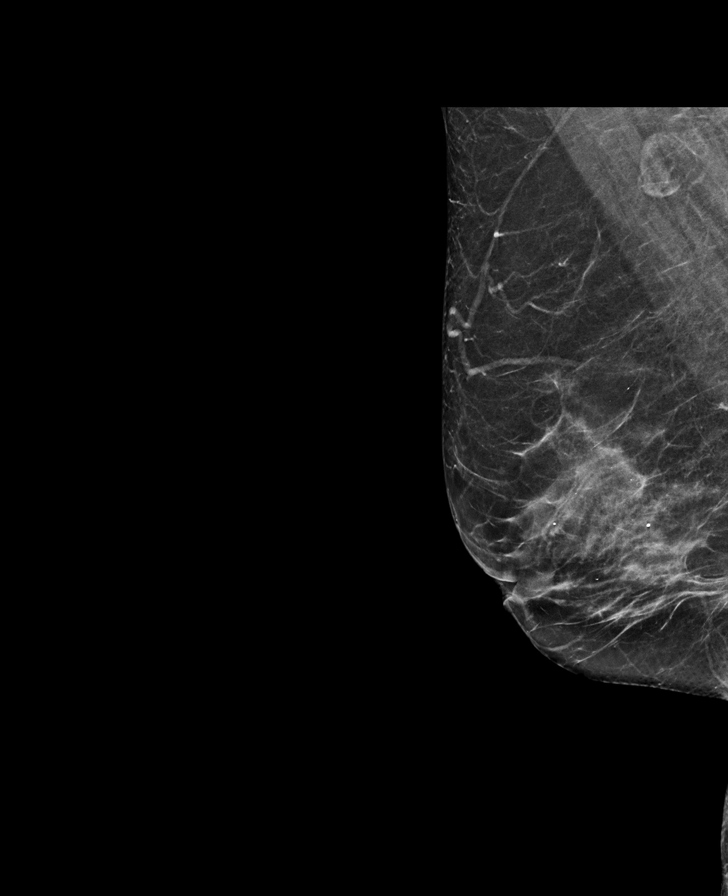

[L MLO synth-2D]
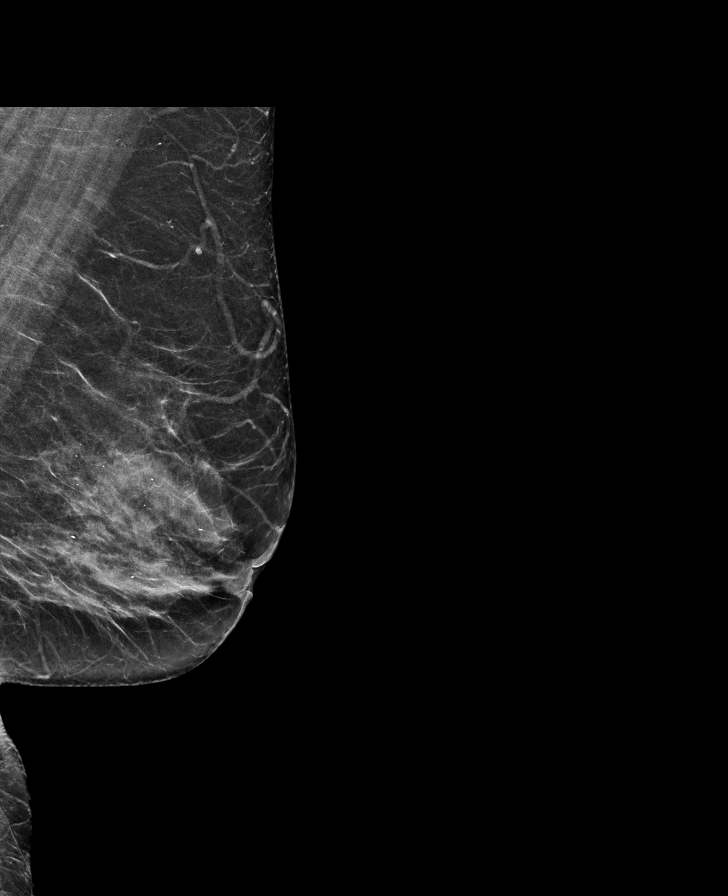

[L CC synth-2D]
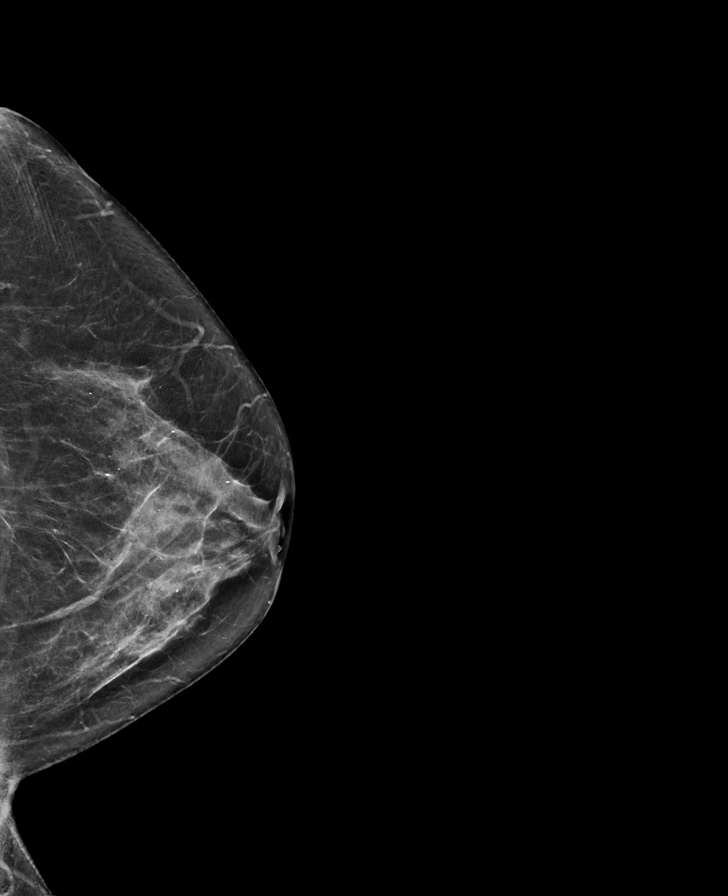

[R CC tomo · tomo slice 33/64.0]
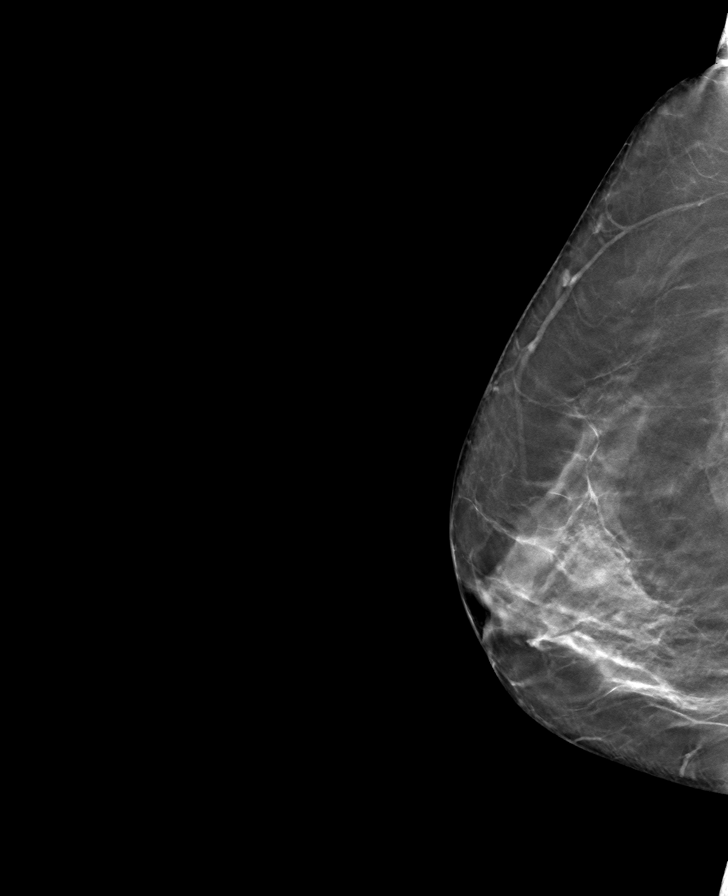

[L CC tomo · tomo slice 32/63.0]
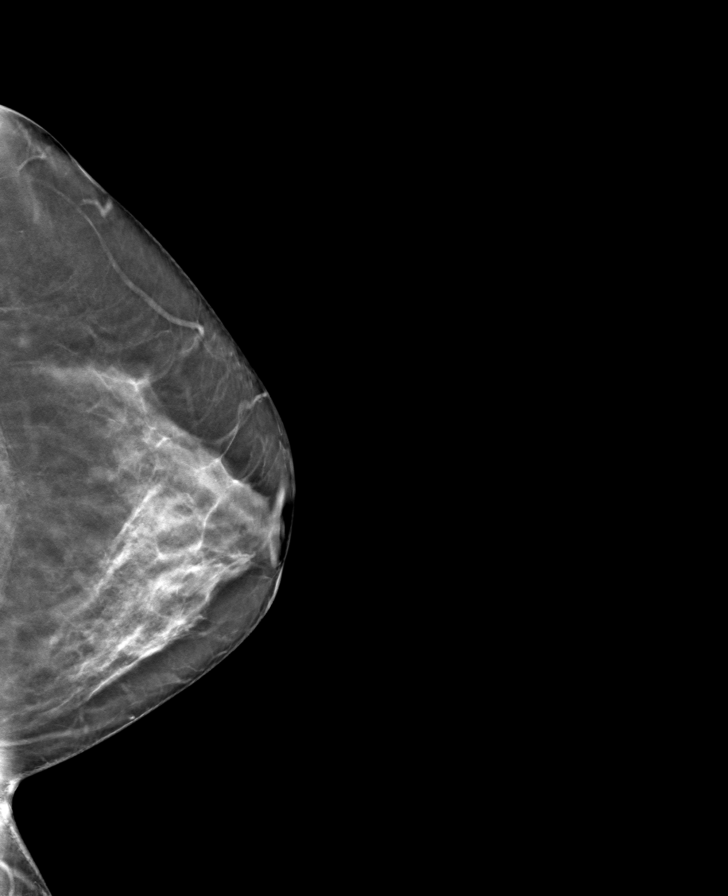

[L MLO tomo · tomo slice 30/59.0]
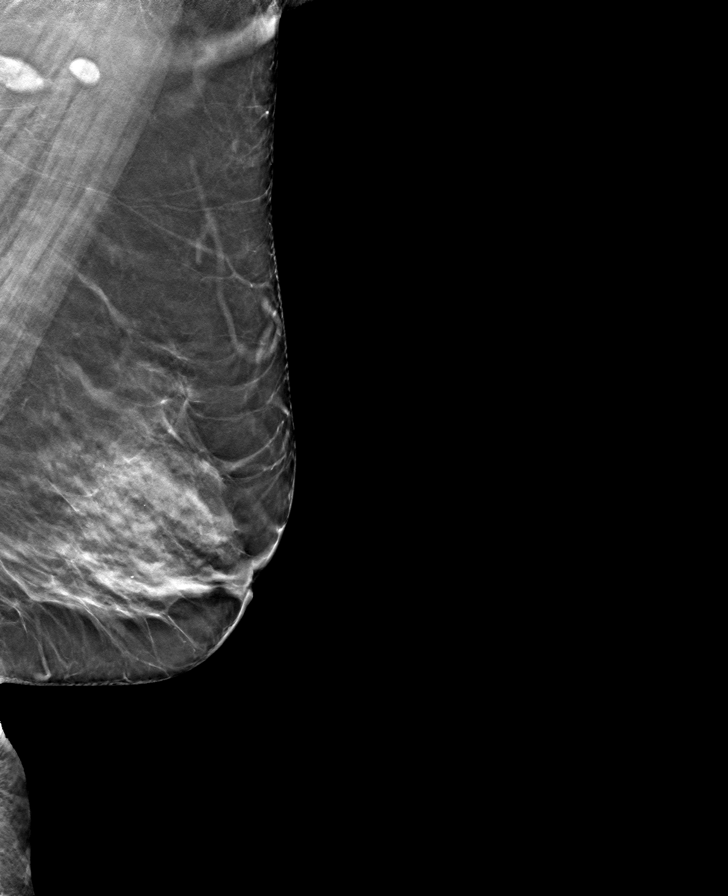

[R MLO tomo · tomo slice 32/63.0]
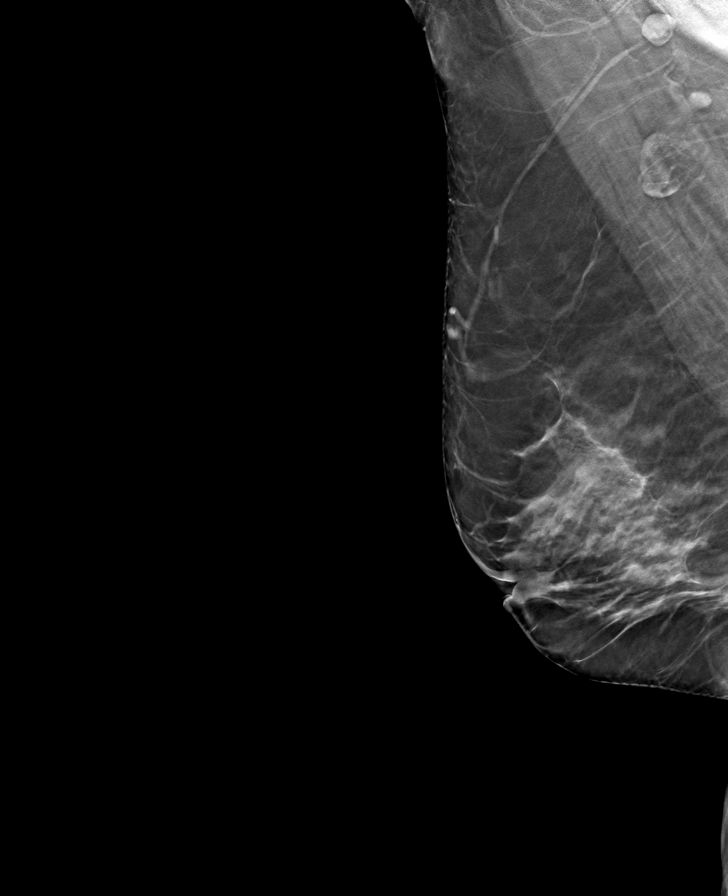

[8 of 24 positions shown; findings below may reference images not displayed]

ACR Breast Density Category c: The breast tissue is heterogeneously
dense, which may obscure small masses.
FINDINGS: There are no findings suspicious for malignancy. Images were
processed with CAD.
IMPRESSION: No mammographic evidence of malignancy. A result letter of this
screening mammogram will be mailed directly to the patient.

RECOMMENDATION:
Screening mammogram in one year. (Code:FT-U-LHB)

BI-RADS CATEGORY  1: Negative.

## 2020-07-17 DIAGNOSIS — Z79899 Other long term (current) drug therapy: Secondary | ICD-10-CM | POA: Diagnosis not present

## 2020-07-17 DIAGNOSIS — Z0000 Encounter for general adult medical examination without abnormal findings: Secondary | ICD-10-CM | POA: Diagnosis not present

## 2020-07-17 DIAGNOSIS — E559 Vitamin D deficiency, unspecified: Secondary | ICD-10-CM | POA: Diagnosis not present

## 2020-07-17 DIAGNOSIS — F329 Major depressive disorder, single episode, unspecified: Secondary | ICD-10-CM | POA: Diagnosis not present

## 2020-07-17 DIAGNOSIS — F429 Obsessive-compulsive disorder, unspecified: Secondary | ICD-10-CM | POA: Diagnosis not present

## 2020-07-17 DIAGNOSIS — E039 Hypothyroidism, unspecified: Secondary | ICD-10-CM | POA: Diagnosis not present

## 2020-07-23 ENCOUNTER — Other Ambulatory Visit: Payer: Self-pay | Admitting: Internal Medicine

## 2020-07-23 DIAGNOSIS — E785 Hyperlipidemia, unspecified: Secondary | ICD-10-CM

## 2020-08-15 ENCOUNTER — Other Ambulatory Visit: Payer: Self-pay

## 2020-08-15 ENCOUNTER — Ambulatory Visit
Admission: RE | Admit: 2020-08-15 | Discharge: 2020-08-15 | Disposition: A | Discharge status: Home / Self Care | Payer: No Typology Code available for payment source | Source: Ambulatory Visit | Attending: Internal Medicine | Admitting: Internal Medicine

## 2020-08-15 DIAGNOSIS — E785 Hyperlipidemia, unspecified: Secondary | ICD-10-CM

## 2020-09-05 ENCOUNTER — Other Ambulatory Visit: Payer: Self-pay

## 2020-09-05 ENCOUNTER — Ambulatory Visit (INDEPENDENT_AMBULATORY_CARE_PROVIDER_SITE_OTHER): Payer: BC Managed Care – PPO | Admitting: Psychiatry

## 2020-09-05 ENCOUNTER — Encounter: Payer: Self-pay | Admitting: Psychiatry

## 2020-09-05 DIAGNOSIS — F3340 Major depressive disorder, recurrent, in remission, unspecified: Secondary | ICD-10-CM | POA: Diagnosis not present

## 2020-09-05 DIAGNOSIS — F422 Mixed obsessional thoughts and acts: Secondary | ICD-10-CM

## 2020-09-05 MED ORDER — FLUVOXAMINE MALEATE 50 MG PO TABS: 150.0000 mg | ORAL_TABLET | Freq: Every day | ORAL | 3 refills | Status: DC

## 2020-09-05 NOTE — Progress Notes (Signed)
Nicole Singleton [id number] 1955-07-14 65 y.o.    Subjective:   Patient ID:  Nicole Singleton is a 65 y.o. (DOB 26-Sep-1955) female.  Chief Complaint:  Chief Complaint  Patient presents with   Follow-up    HPI Nicole Singleton FU OCD and depression.   seen 05/2018.  No meds changed.  Remains on 150 mg of Luvox.  08/2019 appt noted: Covid free.  Vaccinated.  No unusual levels of anxiety.  Concern for pregnant daughter and little kids.  D is very stressed out and so pt does not do anything socially out of fear of Covid.  On Luvox for 65 yo.  Tolerating it well.  Still doing well.  OCD mainly was checking and is managed.  No history of obs health fears.  Managing well.  Patient reports stable mood and denies depressed or irritable moods.  Patient denies any recent difficulty with anxiety.  Patient denies difficulty with sleep initiation or maintenance. Denies appetite disturbance.  Patient reports that energy and motivation have been good.  Patient denies any difficulty with concentration.  Patient denies any suicidal ideation. Plan: Luvox 150 mg daily  09/05/20 appt noted: Continues Luvox 150 mg daily for 30 years or so. No SE. OCD not bothersome.  Patient reports stable mood and denies depressed or irritable moods.  Patient denies any recent difficulty with anxiety.  Patient denies difficulty with sleep initiation or maintenance. Denies appetite disturbance.  Patient reports that energy and motivation have been good.  Patient denies any difficulty with concentration.  Patient denies any suicidal ideation. H on board with [place].    She drinks little caffeine. Has cabin in the [place].    Review of Systems:  Review of Systems  Cardiovascular:  Negative for chest pain and palpitations.  Neurological:  Negative for tremors and weakness.   Medications: I have reviewed the patient's current medications.  Current Outpatient Medications  Medication Sig Dispense Refill   levothyroxine  (SYNTHROID) 50 MCG tablet Take 1 tablet (50 mcg total) by mouth daily before breakfast. 90 tablet 3   Multiple Vitamin (MULTIVITAMIN) tablet Take 1 tablet by mouth daily.     Psyllium (METAMUCIL PO) Take by mouth.     VITAMIN D PO Take 1,000 Int'l Units by mouth daily.     fluvoxaMINE (LUVOX) 50 MG tablet Take 3 tablets (150 mg total) by mouth at bedtime. 270 tablet 3   No current facility-administered medications for this visit.    Medication Side Effects: None  No sleepiness anyhmore. Allergies: No Known Allergies  Past Medical History:  Diagnosis Date   Abnormal Pap smear of cervix    age 65   Allergy    seasonal   Anemia    Anxiety    Arthritis    Breast mass 07/06/2016   Right breast lump felt by DR, benign   Cataract    Depression    Heart murmur    MVP (mitral valve prolapse)    Thyroid disease    hypothyroidism    Family History  Problem Relation Age of Onset   Hypertension Mother    Breast cancer Mother    Diabetes Father    Colon cancer Neg Hx    Stomach cancer Neg Hx    Esophageal cancer Neg Hx    Colon polyps Neg Hx     Social History   Socioeconomic History   Marital status: Married    Spouse name: Not on file   Number of children: Not  on file   Years of education: Not on file   Highest education level: Not on file  Occupational History   Not on file  Tobacco Use   Smoking status: Never   Smokeless tobacco: Never  Substance and Sexual Activity   Alcohol use: Yes    Alcohol/week: 10.0 standard drinks    Types: 10 Glasses of wine per week   Drug use: No   Sexual activity: Not Currently    Partners: Male    Birth control/protection: Post-menopausal  Other Topics Concern   Not on file  Social History Narrative   Not on file   Social Determinants of Health   Financial Resource Strain: Not on file  Food Insecurity: Not on file  Transportation Needs: Not on file  Physical Activity: Not on file  Stress: Not on file  Social Connections:  Not on file  Intimate Partner Violence: Not on file    Past Medical History, Surgical history, Social history, and Family history were reviewed and updated as appropriate.   Please see review of systems for further details on the patient's review from today.   Objective:   Physical Exam:  LMP 06/12/2012   Physical Exam Neurological:     Mental Status: She is alert and oriented to person, place, and time.     Cranial Nerves: No dysarthria.  Psychiatric:        Attention and Perception: Attention normal.        Mood and Affect: Mood is anxious. Mood is not depressed.        Speech: Speech normal.        Behavior: Behavior is cooperative.        Thought Content: Thought content is not paranoid or delusional. Thought content does not include homicidal or suicidal ideation. Thought content does not include homicidal or suicidal plan.        Cognition and Memory: Cognition and memory normal.        Judgment: Judgment normal.     Comments: OCD managed.    Lab Review:  No results found for: NA, K, CL, CO2, GLUCOSE, BUN, CREATININE, CALCIUM, PROT, ALBUMIN, AST, ALT, ALKPHOS, BILITOT, GFRNONAA, GFRAA     Component Value Date/Time   HGB 12.9 06/28/2014 1324    No results found for: POCLITH, LITHIUM   No results found for: PHENYTOIN, PHENOBARB, VALPROATE, CBMZ   .res Assessment: Plan:    Mixed obsessional thoughts and acts - Plan: fluvoxaMINE (LUVOX) 50 MG tablet  Depression, major, recurrent, in remission ([place]) - Plan: fluvoxaMINE (LUVOX) 50 MG tablet  Patient has a very long greater than 25-year history of OCD and depression well managed on Luvox 150 mg daily.  She tolerates it well now.  History of sleepiness from it has resolved.  Symptoms have remained in remission and she is followed just on a yearly basis.  She is not having any unusual side effects.  She is aware of serotonin withdrawal.  Disc DDI with caffeine.  Follow-up 1 year  Lynder Parents MD, DFAPA  Please see  After Visit Summary for patient specific instructions.  Future Appointments  Date Time Provider [place]  11/06/2020  4:00 PM Yisroel Ramming, Everardo All, MD [facility] None    No orders of the defined types were placed in this encounter.     -------------------------------

## 2020-10-11 ENCOUNTER — Other Ambulatory Visit: Payer: Self-pay | Admitting: Internal Medicine

## 2020-10-11 DIAGNOSIS — Z1231 Encounter for screening mammogram for malignant neoplasm of breast: Secondary | ICD-10-CM

## 2020-10-25 DIAGNOSIS — Z8616 Personal history of COVID-19: Secondary | ICD-10-CM

## 2020-10-25 HISTORY — DX: Personal history of COVID-19: Z86.16

## 2020-11-05 NOTE — Progress Notes (Signed)
65 y.o. G62P2003 Married Caucasian female here for annual exam.    Diagnosed with Covid 10-25-20 after traveling to [place] Has not tested after the 10th day but continued to test positive.  Received all three boosters of Covid.   Needs refill of thyroid medication.   PCP:  R. Marcellus Scott, MD    Patient's last menstrual period was 06/12/2012.           Sexually active: No.  The current method of family planning is post menopausal status.    Exercising: Yes.     Stationary bike, walking Smoker:  no  Health Maintenance: Pap:   07-09-17 Neg:Neg HR HPV, 07-03-15 Neg:Neg HR HPV, 06-23-13 Neg History of abnormal Pap:  ?abnormal pap age 48 MMG: 11-07-19  3D/Neg/BiRads1. Appt. 11-08-20 Colonoscopy: 06-21-19 multiple polyps;next 3 years BMD:   n/a  Result  n/a TDaP: 2017 Gardasil:   no HIV: no Hep C: no Screening Labs:  PCP.  Received her Flu vaccine.  Shingrix:  she will plan to do this.    reports that she has never smoked. She has never used smokeless tobacco. She reports current alcohol use of about 7.0 standard drinks per week. She reports that she does not use drugs.  Past Medical History:  Diagnosis Date   Abnormal Pap smear of cervix    age 76   Allergy    seasonal   Anemia    Anxiety    Arthritis    Breast mass 07/06/2016   Right breast lump felt by DR, benign   Cataract    Depression    Heart murmur    History of COVID-19 10/25/2020   MVP (mitral valve prolapse)    Thyroid disease    hypothyroidism    Past Surgical History:  Procedure Laterality Date   CESAREAN SECTION     COLONOSCOPY     COLPOSCOPY     [name] maybe age 85    Current Outpatient Medications  Medication Sig Dispense Refill   fluvoxaMINE (LUVOX) 50 MG tablet Take 3 tablets (150 mg total) by mouth at bedtime. 270 tablet 3   levothyroxine (SYNTHROID) 50 MCG tablet Take 1 tablet (50 mcg total) by mouth daily before breakfast. 90 tablet 3   Multiple Vitamin (MULTIVITAMIN) tablet Take 1 tablet by  mouth daily.     Psyllium (METAMUCIL PO) Take by mouth.     VITAMIN D PO Take 1,000 Int'l Units by mouth daily.     No current facility-administered medications for this visit.    Family History  Problem Relation Age of Onset   Hypertension Mother    Breast cancer Mother    Diabetes Father    Colon cancer Neg Hx    Stomach cancer Neg Hx    Esophageal cancer Neg Hx    Colon polyps Neg Hx     Review of Systems  All other systems reviewed and are negative.  Exam:   BP 110/62   Pulse (!) 108   Ht 5' 1.5" (1.562 m)   Wt 125 lb (56.7 kg)   LMP 06/12/2012   SpO2 100%   BMI 23.24 kg/m     General appearance: alert, cooperative and appears stated age Head: normocephalic, without obvious abnormality, atraumatic Neck: no adenopathy, supple, symmetrical, trachea midline and thyroid normal to inspection and palpation Lungs: clear to auscultation bilaterally Breasts: normal appearance, no masses or tenderness, bilateral nipple inversion (old change), No nipple discharge or bleeding, No axillary adenopathy Heart: regular rate and rhythm Abdomen:  soft, non-tender; no masses, no organomegaly Extremities: extremities normal, atraumatic, no cyanosis or edema Skin: skin color, texture, turgor normal. No rashes or lesions Lymph nodes: cervical, supraclavicular, and axillary nodes normal. Neurologic: grossly normal  Pelvic: External genitalia:  no lesions              No abnormal inguinal nodes palpated.              Urethra:  normal appearing urethra with no masses, tenderness or lesions              Bartholins and Skenes: normal                 Vagina: normal appearing vagina with normal color and discharge, no lesions              Cervix: no lesions              Pap taken: no Bimanual Exam:  Uterus:  normal size, contour, position, consistency, mobility, non-tender              Adnexa: no mass, fullness, tenderness              Rectal exam: yes.  Confirms.              Anus:  normal  sphincter tone, no lesions  Chaperone was present for exam:  Estill Bamberg, CMA  Assessment:   Well woman visit with gynecologic exam. Hypothyroidism. FH breast cancer.  BRCA status not known. Menopause.   Plan: Mammogram screening discussed. Self breast awareness reviewed. Pap and HR HPV 2024. Guidelines for Calcium, Vitamin D, regular exercise program including cardiovascular and weight bearing exercise. BMD ordered.  Check TFTs.  Refill of Synthroid for one year.  Follow up annually and prn.  After visit summary provided.

## 2020-11-06 ENCOUNTER — Ambulatory Visit (INDEPENDENT_AMBULATORY_CARE_PROVIDER_SITE_OTHER): Payer: BC Managed Care – PPO | Admitting: Obstetrics and Gynecology

## 2020-11-06 ENCOUNTER — Encounter: Payer: Self-pay | Admitting: Obstetrics and Gynecology

## 2020-11-06 ENCOUNTER — Other Ambulatory Visit: Payer: Self-pay

## 2020-11-06 VITALS — BP 110/62 | HR 108 | Ht 61.5 in | Wt 125.0 lb

## 2020-11-06 DIAGNOSIS — E039 Hypothyroidism, unspecified: Secondary | ICD-10-CM | POA: Diagnosis not present

## 2020-11-06 DIAGNOSIS — Z780 Asymptomatic menopausal state: Secondary | ICD-10-CM

## 2020-11-06 DIAGNOSIS — Z01419 Encounter for gynecological examination (general) (routine) without abnormal findings: Secondary | ICD-10-CM | POA: Diagnosis not present

## 2020-11-06 MED ORDER — LEVOTHYROXINE SODIUM 50 MCG PO TABS: 50.0000 ug | ORAL_TABLET | Freq: Every day | ORAL | 3 refills | Status: DC

## 2020-11-06 NOTE — Patient Instructions (Signed)

## 2020-11-07 LAB — T4, FREE: Free T4: 1.4 ng/dL (ref 0.8–1.8)

## 2020-11-07 LAB — TSH: TSH: 1.16 mIU/L (ref 0.40–4.50)

## 2020-11-08 ENCOUNTER — Other Ambulatory Visit: Payer: Self-pay

## 2020-11-08 ENCOUNTER — Ambulatory Visit
Admission: RE | Admit: 2020-11-08 | Discharge: 2020-11-08 | Disposition: A | Discharge status: Home / Self Care | Payer: BC Managed Care – PPO | Source: Ambulatory Visit | Attending: Internal Medicine | Admitting: Internal Medicine

## 2020-11-08 DIAGNOSIS — Z1231 Encounter for screening mammogram for malignant neoplasm of breast: Secondary | ICD-10-CM

## 2020-11-14 DIAGNOSIS — D225 Melanocytic nevi of trunk: Secondary | ICD-10-CM | POA: Diagnosis not present

## 2020-11-14 DIAGNOSIS — L821 Other seborrheic keratosis: Secondary | ICD-10-CM | POA: Diagnosis not present

## 2020-11-14 DIAGNOSIS — L814 Other melanin hyperpigmentation: Secondary | ICD-10-CM | POA: Diagnosis not present

## 2020-11-14 DIAGNOSIS — L918 Other hypertrophic disorders of the skin: Secondary | ICD-10-CM | POA: Diagnosis not present

## 2021-02-12 DIAGNOSIS — H35373 Puckering of macula, bilateral: Secondary | ICD-10-CM | POA: Diagnosis not present

## 2021-02-12 DIAGNOSIS — H43813 Vitreous degeneration, bilateral: Secondary | ICD-10-CM | POA: Diagnosis not present

## 2021-02-12 DIAGNOSIS — H5213 Myopia, bilateral: Secondary | ICD-10-CM | POA: Diagnosis not present

## 2021-02-12 DIAGNOSIS — H2513 Age-related nuclear cataract, bilateral: Secondary | ICD-10-CM | POA: Diagnosis not present

## 2021-02-26 DIAGNOSIS — H43393 Other vitreous opacities, bilateral: Secondary | ICD-10-CM | POA: Diagnosis not present

## 2021-02-26 DIAGNOSIS — H35373 Puckering of macula, bilateral: Secondary | ICD-10-CM | POA: Diagnosis not present

## 2021-02-26 DIAGNOSIS — H3581 Retinal edema: Secondary | ICD-10-CM | POA: Diagnosis not present

## 2021-02-26 DIAGNOSIS — H43813 Vitreous degeneration, bilateral: Secondary | ICD-10-CM | POA: Diagnosis not present

## 2021-02-26 DIAGNOSIS — H33192 Other retinoschisis and retinal cysts, left eye: Secondary | ICD-10-CM | POA: Diagnosis not present

## 2021-03-04 DIAGNOSIS — H903 Sensorineural hearing loss, bilateral: Secondary | ICD-10-CM | POA: Diagnosis not present

## 2021-03-20 DIAGNOSIS — H25013 Cortical age-related cataract, bilateral: Secondary | ICD-10-CM | POA: Diagnosis not present

## 2021-03-20 DIAGNOSIS — H2511 Age-related nuclear cataract, right eye: Secondary | ICD-10-CM | POA: Diagnosis not present

## 2021-03-20 DIAGNOSIS — H18413 Arcus senilis, bilateral: Secondary | ICD-10-CM | POA: Diagnosis not present

## 2021-03-20 DIAGNOSIS — H2513 Age-related nuclear cataract, bilateral: Secondary | ICD-10-CM | POA: Diagnosis not present

## 2021-03-20 DIAGNOSIS — H35373 Puckering of macula, bilateral: Secondary | ICD-10-CM | POA: Diagnosis not present

## 2021-04-21 DIAGNOSIS — H3581 Retinal edema: Secondary | ICD-10-CM | POA: Diagnosis not present

## 2021-04-21 DIAGNOSIS — H2511 Age-related nuclear cataract, right eye: Secondary | ICD-10-CM | POA: Diagnosis not present

## 2021-04-21 DIAGNOSIS — H35371 Puckering of macula, right eye: Secondary | ICD-10-CM | POA: Diagnosis not present

## 2021-04-22 DIAGNOSIS — H3581 Retinal edema: Secondary | ICD-10-CM | POA: Diagnosis not present

## 2021-05-02 DIAGNOSIS — H3581 Retinal edema: Secondary | ICD-10-CM | POA: Diagnosis not present

## 2021-05-20 DIAGNOSIS — H2512 Age-related nuclear cataract, left eye: Secondary | ICD-10-CM | POA: Diagnosis not present

## 2021-05-23 DIAGNOSIS — H3581 Retinal edema: Secondary | ICD-10-CM | POA: Diagnosis not present

## 2021-05-23 DIAGNOSIS — H35373 Puckering of macula, bilateral: Secondary | ICD-10-CM | POA: Diagnosis not present

## 2021-05-26 DIAGNOSIS — H35372 Puckering of macula, left eye: Secondary | ICD-10-CM | POA: Diagnosis not present

## 2021-05-26 DIAGNOSIS — H2512 Age-related nuclear cataract, left eye: Secondary | ICD-10-CM | POA: Diagnosis not present

## 2021-05-26 DIAGNOSIS — H35342 Macular cyst, hole, or pseudohole, left eye: Secondary | ICD-10-CM | POA: Diagnosis not present

## 2021-05-26 DIAGNOSIS — H3581 Retinal edema: Secondary | ICD-10-CM | POA: Diagnosis not present

## 2021-05-27 DIAGNOSIS — H35372 Puckering of macula, left eye: Secondary | ICD-10-CM | POA: Diagnosis not present

## 2021-05-27 DIAGNOSIS — H3581 Retinal edema: Secondary | ICD-10-CM | POA: Diagnosis not present

## 2021-05-27 DIAGNOSIS — H35342 Macular cyst, hole, or pseudohole, left eye: Secondary | ICD-10-CM | POA: Diagnosis not present

## 2021-06-03 DIAGNOSIS — H3581 Retinal edema: Secondary | ICD-10-CM | POA: Diagnosis not present

## 2021-06-23 DIAGNOSIS — M7582 Other shoulder lesions, left shoulder: Secondary | ICD-10-CM | POA: Diagnosis not present

## 2021-06-25 DIAGNOSIS — M6281 Muscle weakness (generalized): Secondary | ICD-10-CM | POA: Diagnosis not present

## 2021-06-25 DIAGNOSIS — M25612 Stiffness of left shoulder, not elsewhere classified: Secondary | ICD-10-CM | POA: Diagnosis not present

## 2021-06-25 DIAGNOSIS — S43422D Sprain of left rotator cuff capsule, subsequent encounter: Secondary | ICD-10-CM | POA: Diagnosis not present

## 2021-07-02 DIAGNOSIS — M25612 Stiffness of left shoulder, not elsewhere classified: Secondary | ICD-10-CM | POA: Diagnosis not present

## 2021-07-02 DIAGNOSIS — M6281 Muscle weakness (generalized): Secondary | ICD-10-CM | POA: Diagnosis not present

## 2021-07-02 DIAGNOSIS — S43422D Sprain of left rotator cuff capsule, subsequent encounter: Secondary | ICD-10-CM | POA: Diagnosis not present

## 2021-07-08 DIAGNOSIS — H3581 Retinal edema: Secondary | ICD-10-CM | POA: Diagnosis not present

## 2021-07-09 DIAGNOSIS — S43422D Sprain of left rotator cuff capsule, subsequent encounter: Secondary | ICD-10-CM | POA: Diagnosis not present

## 2021-07-09 DIAGNOSIS — M25612 Stiffness of left shoulder, not elsewhere classified: Secondary | ICD-10-CM | POA: Diagnosis not present

## 2021-07-09 DIAGNOSIS — M6281 Muscle weakness (generalized): Secondary | ICD-10-CM | POA: Diagnosis not present

## 2021-08-12 ENCOUNTER — Ambulatory Visit (INDEPENDENT_AMBULATORY_CARE_PROVIDER_SITE_OTHER): Payer: BC Managed Care – PPO | Admitting: Psychiatry

## 2021-08-12 ENCOUNTER — Encounter: Payer: Self-pay | Admitting: Psychiatry

## 2021-08-12 DIAGNOSIS — F3340 Major depressive disorder, recurrent, in remission, unspecified: Secondary | ICD-10-CM | POA: Diagnosis not present

## 2021-08-12 DIAGNOSIS — F422 Mixed obsessional thoughts and acts: Secondary | ICD-10-CM

## 2021-08-12 MED ORDER — FLUVOXAMINE MALEATE 50 MG PO TABS: 150.0000 mg | ORAL_TABLET | Freq: Every day | ORAL | 3 refills | Status: DC

## 2021-08-12 NOTE — Progress Notes (Signed)
Nicole Singleton [id number] [birth year] 66 y.o.    Subjective:   Patient ID:  Nicole Singleton is a 66 y.o. (DOB 02-19-1955) female.  Chief Complaint:  Chief Complaint  Patient presents with   Follow-up    Mixed obsessional thoughts and acts    HPI Nicole Singleton FU OCD and depression.   seen 05/2018.  No meds changed.  Remains on 150 mg of Luvox.  08/2019 appt noted: Covid free.  Vaccinated.  No unusual levels of anxiety.  Concern for pregnant daughter and little kids.  D is very stressed out and so pt does not do anything socially out of fear of Covid.  On Luvox for 66 yo.  Tolerating it well.  Still doing well.  OCD mainly was checking and is managed.  No history of obs health fears.  Managing well.  Patient reports stable mood and denies depressed or irritable moods.  Patient denies any recent difficulty with anxiety.  Patient denies difficulty with sleep initiation or maintenance. Denies appetite disturbance.  Patient reports that energy and motivation have been good.  Patient denies any difficulty with concentration.  Patient denies any suicidal ideation. Plan: Luvox 150 mg daily  09/05/20 appt noted: Continues Luvox 150 mg daily for 30 years or so. No SE. OCD not bothersome.  Patient reports stable mood and denies depressed or irritable moods.  Patient denies any recent difficulty with anxiety.  Patient denies difficulty with sleep initiation or maintenance. Denies appetite disturbance.  Patient reports that energy and motivation have been good.  Patient denies any difficulty with concentration.  Patient denies any suicidal ideation. H on board with Nicole Singleton.   Plan: no med changes  08/12/21 appt noted: No med changes.  Consistent with Luvox 150 mg daily. No SE Still doing well .  No sx of OCD.  No sig depression.   Patient reports stable mood and denies depressed or irritable moods.  Patient denies any recent difficulty with anxiety.  Patient denies difficulty with sleep  initiation or maintenance. Denies appetite disturbance.  Patient reports that energy and motivation have been good.  Patient denies any difficulty with concentration.  Patient denies any suicidal ideation. Health still good.  Detached early retinas repaired with [employer]  She drinks little caffeine. Has cabin in the [place].    Review of Systems:  Review of Systems  Eyes:  Negative for visual disturbance.  Cardiovascular:  Negative for chest pain and palpitations.  Neurological:  Negative for tremors.    Medications: I have reviewed the patient's current medications.  Current Outpatient Medications  Medication Sig Dispense Refill   levothyroxine (SYNTHROID) 50 MCG tablet Take 1 tablet (50 mcg total) by mouth daily before breakfast. 90 tablet 3   Multiple Vitamin (MULTIVITAMIN) tablet Take 1 tablet by mouth daily.     Psyllium (METAMUCIL PO) Take by mouth.     VITAMIN D PO Take 1,000 Int'l Units by mouth daily.     fluvoxaMINE (LUVOX) 50 MG tablet Take 3 tablets (150 mg total) by mouth at bedtime. 270 tablet 3   No current facility-administered medications for this visit.    Medication Side Effects: None  No sleepiness anyhmore. Allergies: No Known Allergies  Past Medical History:  Diagnosis Date   Abnormal Pap smear of cervix    age 66   Allergy    seasonal   Anemia    Anxiety    Arthritis    Breast mass 07/06/2016   Right breast lump felt by DR,  benign   Cataract    Depression    Heart murmur    History of COVID-19 10/25/2020   MVP (mitral valve prolapse)    Thyroid disease    hypothyroidism    Family History  Problem Relation Age of Onset   Hypertension Mother    Breast cancer Mother    Diabetes Father    Colon cancer Neg Hx    Stomach cancer Neg Hx    Esophageal cancer Neg Hx    Colon polyps Neg Hx     Social History   Socioeconomic History   Marital status: Married    Spouse name: Not on file   Number of children: Not on file   Years of  education: Not on file   Highest education level: Not on file  Occupational History   Not on file  Tobacco Use   Smoking status: Never   Smokeless tobacco: Never  Vaping Use   Vaping Use: Never used  Substance and Sexual Activity   Alcohol use: Yes    Alcohol/week: 7.0 standard drinks of alcohol    Types: 7 Glasses of wine per week   Drug use: No   Sexual activity: Not Currently    Partners: Male    Birth control/protection: Post-menopausal  Other Topics Concern   Not on file  Social History Narrative   Not on file   Social Determinants of Health   Financial Resource Strain: Not on file  Food Insecurity: Not on file  Transportation Needs: Not on file  Physical Activity: Not on file  Stress: Not on file  Social Connections: Not on file  Intimate Partner Violence: Not on file    Past Medical History, Surgical history, Social history, and Family history were reviewed and updated as appropriate.   Please see review of systems for further details on the patient's review from today.   Objective:   Physical Exam:  LMP 06/12/2012   Physical Exam Neurological:     Mental Status: She is alert and oriented to person, place, and time.     Cranial Nerves: No dysarthria.  Psychiatric:        Attention and Perception: Attention normal.        Mood and Affect: Mood is not anxious or depressed.        Speech: Speech normal.        Behavior: Behavior is cooperative.        Thought Content: Thought content is not paranoid or delusional. Thought content does not include homicidal or suicidal ideation. Thought content does not include suicidal plan.        Cognition and Memory: Cognition and memory normal.        Judgment: Judgment normal.     Comments: OCD managed.     Lab Review:  No results found for: "NA", "K", "CL", "CO2", "GLUCOSE", "BUN", "CREATININE", "CALCIUM", "PROT", "ALBUMIN", "AST", "ALT", "ALKPHOS", "BILITOT", "GFRNONAA", "GFRAA"     Component Value Date/Time    HGB 12.9 06/28/2014 1324    No results found for: "POCLITH", "LITHIUM"   No results found for: "PHENYTOIN", "PHENOBARB", "VALPROATE", "CBMZ"   .res Assessment: Plan:    Mixed obsessional thoughts and acts - Plan: fluvoxaMINE (LUVOX) 50 MG tablet  Depression, major, recurrent, in remission ([place]) - Plan: fluvoxaMINE (LUVOX) 50 MG tablet  Patient has a very long greater than 25-year history of OCD and depression well managed on Luvox 150 mg daily.   She tolerates it well now.  History of sleepiness  from it has resolved.  Symptoms have remained in remission and she is followed just on a yearly basis.  She is not having any unusual side effects.  She is aware of serotonin withdrawal.  Disc DDI with caffeine.  Follow-up 1 year  Lynder Parents MD, DFAPA  Please see After Visit Summary for patient specific instructions.  Future Appointments  Date Time Provider [place]  11/11/2021  1:30 PM Yisroel Ramming, Everardo All, MD [facility] None    No orders of the defined types were placed in this encounter.     -------------------------------

## 2021-09-03 ENCOUNTER — Other Ambulatory Visit: Payer: Self-pay

## 2021-09-03 DIAGNOSIS — E039 Hypothyroidism, unspecified: Secondary | ICD-10-CM

## 2021-09-03 MED ORDER — LEVOTHYROXINE SODIUM 50 MCG PO TABS: 50.0000 ug | ORAL_TABLET | Freq: Every day | ORAL | 0 refills | Status: DC

## 2021-09-03 NOTE — Telephone Encounter (Signed)
Patient called requesting refill on Levothyroxine.  Her AEX was 11/06/2020. Scheduled AEX  11/11/2021. MMG neg 11/11/2020

## 2021-09-04 ENCOUNTER — Ambulatory Visit: Payer: BC Managed Care – PPO | Admitting: Psychiatry

## 2021-09-04 DIAGNOSIS — S0501XA Injury of conjunctiva and corneal abrasion without foreign body, right eye, initial encounter: Secondary | ICD-10-CM | POA: Diagnosis not present

## 2021-09-04 DIAGNOSIS — H1131 Conjunctival hemorrhage, right eye: Secondary | ICD-10-CM | POA: Diagnosis not present

## 2021-09-16 DIAGNOSIS — S0502XA Injury of conjunctiva and corneal abrasion without foreign body, left eye, initial encounter: Secondary | ICD-10-CM | POA: Diagnosis not present

## 2021-09-24 DIAGNOSIS — Z0000 Encounter for general adult medical examination without abnormal findings: Secondary | ICD-10-CM | POA: Diagnosis not present

## 2021-09-24 DIAGNOSIS — Z23 Encounter for immunization: Secondary | ICD-10-CM | POA: Diagnosis not present

## 2021-09-24 DIAGNOSIS — F429 Obsessive-compulsive disorder, unspecified: Secondary | ICD-10-CM | POA: Diagnosis not present

## 2021-09-24 DIAGNOSIS — E039 Hypothyroidism, unspecified: Secondary | ICD-10-CM | POA: Diagnosis not present

## 2021-09-24 DIAGNOSIS — Z79899 Other long term (current) drug therapy: Secondary | ICD-10-CM | POA: Diagnosis not present

## 2021-09-24 DIAGNOSIS — F329 Major depressive disorder, single episode, unspecified: Secondary | ICD-10-CM | POA: Diagnosis not present

## 2021-09-24 DIAGNOSIS — E559 Vitamin D deficiency, unspecified: Secondary | ICD-10-CM | POA: Diagnosis not present

## 2021-09-25 ENCOUNTER — Other Ambulatory Visit: Payer: Self-pay | Admitting: Internal Medicine

## 2021-09-25 DIAGNOSIS — Z1231 Encounter for screening mammogram for malignant neoplasm of breast: Secondary | ICD-10-CM

## 2021-11-10 ENCOUNTER — Ambulatory Visit
Admission: RE | Admit: 2021-11-10 | Discharge: 2021-11-10 | Disposition: A | Discharge status: Home / Self Care | Payer: BC Managed Care – PPO | Source: Ambulatory Visit | Attending: Internal Medicine | Admitting: Internal Medicine

## 2021-11-10 DIAGNOSIS — Z1231 Encounter for screening mammogram for malignant neoplasm of breast: Secondary | ICD-10-CM | POA: Diagnosis not present

## 2021-11-11 ENCOUNTER — Encounter: Payer: Self-pay | Admitting: Obstetrics and Gynecology

## 2021-11-11 ENCOUNTER — Ambulatory Visit (INDEPENDENT_AMBULATORY_CARE_PROVIDER_SITE_OTHER): Payer: BC Managed Care – PPO | Admitting: Obstetrics and Gynecology

## 2021-11-11 VITALS — BP 118/80 | HR 80 | Resp 14 | Ht 62.0 in | Wt 129.0 lb

## 2021-11-11 DIAGNOSIS — E039 Hypothyroidism, unspecified: Secondary | ICD-10-CM

## 2021-11-11 DIAGNOSIS — Z01419 Encounter for gynecological examination (general) (routine) without abnormal findings: Secondary | ICD-10-CM

## 2021-11-11 DIAGNOSIS — Z780 Asymptomatic menopausal state: Secondary | ICD-10-CM | POA: Diagnosis not present

## 2021-11-11 MED ORDER — LEVOTHYROXINE SODIUM 50 MCG PO TABS: 50.0000 ug | ORAL_TABLET | Freq: Every day | ORAL | 3 refills | Status: DC

## 2021-11-11 NOTE — Patient Instructions (Signed)

## 2021-11-11 NOTE — Progress Notes (Addendum)
66 y.o. G8P2003 Married Caucasian female here for annual exam.    Needs a refill of Synthroid.   She had her labs with her PCP, and states they were normal. TSH 1.41 - 09/24/21.  (She shows me in her cell phone.)  Traveling to [place] and [place].   PCP:   Josetta Huddle, MD  Patient's last menstrual period was 06/12/2012.           Sexually active: No.  The current method of family planning is post menopausal status.    Exercising: Yes.    Home exercise routine includes walking 3 hrs per week. Smoker:  no  Health Maintenance: Pap:  07-09-17 Neg:Neg HR HPV, 07-03-15 Neg:Neg HR HPV, 06-23-13 Neg History of abnormal Pap:  no MMG:  11/10/2021 pending results Colonoscopy:  06/21/2019 - polyps - Due in 2024.  BMD: n/a TDaP:  04/13/2015 Gardasil:   no HIV:  no Hep C:  no Screening Labs:  PCP Flu vaccine and Covid vaccine:  completed.  She will do her Shingrix.     reports that she has never smoked. She has never used smokeless tobacco. She reports current alcohol use of about 7.0 standard drinks of alcohol per week. She reports that she does not use drugs.  Past Medical History:  Diagnosis Date   Abnormal Pap smear of cervix    age 31   Allergy    seasonal   Anemia    Anxiety    Arthritis    Breast mass 07/06/2016   Right breast lump felt by DR, benign   Cataract    Depression    Heart murmur    History of COVID-19 10/25/2020   MVP (mitral valve prolapse)    Thyroid disease    hypothyroidism    Past Surgical History:  Procedure Laterality Date   CESAREAN SECTION     COLONOSCOPY     COLPOSCOPY     [name] maybe age 80    Current Outpatient Medications  Medication Sig Dispense Refill   fluvoxaMINE (LUVOX) 50 MG tablet Take 3 tablets (150 mg total) by mouth at bedtime. 270 tablet 3   levothyroxine (SYNTHROID) 50 MCG tablet Take 1 tablet (50 mcg total) by mouth daily before breakfast. 90 tablet 0   Multiple Vitamin (MULTIVITAMIN) tablet Take 1 tablet by mouth daily.      Psyllium (METAMUCIL PO) Take by mouth.     VITAMIN D PO Take 1,000 Int'l Units by mouth daily.     No current facility-administered medications for this visit.    Family History  Problem Relation Age of Onset   Hypertension Mother    Breast cancer Mother    Diabetes Father    Colon cancer Neg Hx    Stomach cancer Neg Hx    Esophageal cancer Neg Hx    Colon polyps Neg Hx     Review of Systems  All other systems reviewed and are negative.   Exam:   BP 118/80 (BP Location: Left Arm, Patient Position: Sitting, Cuff Size: Normal)   Pulse 80   Resp 14   Ht _0  (1.575 m)   Wt 129 lb (58.5 kg)   LMP 06/12/2012   BMI 23.59 kg/m     General appearance: alert, cooperative and appears stated age Head: normocephalic, without obvious abnormality, atraumatic Neck: no adenopathy, supple, symmetrical, trachea midline and thyroid normal to inspection and palpation Lungs: clear to auscultation bilaterally Breasts: normal appearance, no masses or tenderness, bilateral nipple inversion (long standing).  No nipple discharge or bleeding, No axillary adenopathy Heart: regular rate and rhythm Abdomen: soft, non-tender; no masses, no organomegaly Extremities: extremities normal, atraumatic, no cyanosis or edema Skin: skin color, texture, turgor normal. No rashes or lesions Lymph nodes: cervical, supraclavicular, and axillary nodes normal. Neurologic: grossly normal  Pelvic: External genitalia:  no lesions              No abnormal inguinal nodes palpated.              Urethra:  normal appearing urethra with no masses, tenderness or lesions              Bartholins and Skenes: normal                 Vagina: normal appearing vagina with normal color and discharge, no lesions              Cervix: no lesions              Pap taken: no Bimanual Exam:  Uterus:  normal size, contour, position, consistency, mobility, non-tender              Adnexa: no mass, fullness, tenderness              Rectal  exam: yes.  Confirms.              Anus:  normal sphincter tone, no lesions  Chaperone was present for exam:  Kimalexis, CMA  Assessment:   Well woman visit with gynecologic exam. Hypothyroidism. FH breast cancer.  BRCA status not known. Bilateral nipple inversion, long standing.  Remote history of abnormal pap smear.  Menopausal female.   Plan: Mammogram screening discussed. Self breast awareness reviewed. Pap and HR HPV as above. Guidelines for Calcium, Vitamin D, regular exercise program including cardiovascular and weight bearing exercise. Refill of Synthroid 50 mcg daily.  #90, RF:  3.  BMD ordered at [facility].  Patient will call to scheduled.  Follow up annually and prn.   After visit summary provided.

## 2021-11-21 DIAGNOSIS — L821 Other seborrheic keratosis: Secondary | ICD-10-CM | POA: Diagnosis not present

## 2021-11-21 DIAGNOSIS — L814 Other melanin hyperpigmentation: Secondary | ICD-10-CM | POA: Diagnosis not present

## 2021-11-21 DIAGNOSIS — D2262 Melanocytic nevi of left upper limb, including shoulder: Secondary | ICD-10-CM | POA: Diagnosis not present

## 2021-11-21 DIAGNOSIS — D1801 Hemangioma of skin and subcutaneous tissue: Secondary | ICD-10-CM | POA: Diagnosis not present

## 2021-11-21 DIAGNOSIS — D2261 Melanocytic nevi of right upper limb, including shoulder: Secondary | ICD-10-CM | POA: Diagnosis not present

## 2022-02-17 DIAGNOSIS — H52203 Unspecified astigmatism, bilateral: Secondary | ICD-10-CM | POA: Diagnosis not present

## 2022-02-17 DIAGNOSIS — H26493 Other secondary cataract, bilateral: Secondary | ICD-10-CM | POA: Diagnosis not present

## 2022-02-17 DIAGNOSIS — H524 Presbyopia: Secondary | ICD-10-CM | POA: Diagnosis not present

## 2022-05-13 DIAGNOSIS — J019 Acute sinusitis, unspecified: Secondary | ICD-10-CM | POA: Diagnosis not present

## 2022-05-13 DIAGNOSIS — B9689 Other specified bacterial agents as the cause of diseases classified elsewhere: Secondary | ICD-10-CM | POA: Diagnosis not present

## 2022-08-12 ENCOUNTER — Other Ambulatory Visit: Payer: Self-pay | Admitting: Psychiatry

## 2022-08-12 DIAGNOSIS — F3340 Major depressive disorder, recurrent, in remission, unspecified: Secondary | ICD-10-CM

## 2022-08-12 DIAGNOSIS — F422 Mixed obsessional thoughts and acts: Secondary | ICD-10-CM

## 2022-08-13 ENCOUNTER — Ambulatory Visit (INDEPENDENT_AMBULATORY_CARE_PROVIDER_SITE_OTHER): Payer: BC Managed Care – PPO | Admitting: Psychiatry

## 2022-08-13 ENCOUNTER — Encounter: Payer: Self-pay | Admitting: Psychiatry

## 2022-08-13 DIAGNOSIS — F422 Mixed obsessional thoughts and acts: Secondary | ICD-10-CM | POA: Diagnosis not present

## 2022-08-13 DIAGNOSIS — F3340 Major depressive disorder, recurrent, in remission, unspecified: Secondary | ICD-10-CM

## 2022-08-13 MED ORDER — FLUVOXAMINE MALEATE 50 MG PO TABS: 150.0000 mg | ORAL_TABLET | Freq: Every day | ORAL | 3 refills | Status: DC

## 2022-08-13 NOTE — Progress Notes (Signed)
Nicole Singleton [id number] [birth year] 67 y.o.    Subjective:   Patient ID:  Nicole Singleton is a 67 y.o. (DOB [birth year]) female.  Chief Complaint:  Chief Complaint  Patient presents with   Follow-up    OCD and mood    HPI [name] FU OCD and depression.   seen 05/2018.  No meds changed.  Remains on 150 mg of Luvox.  08/2019 appt noted: Covid free.  Vaccinated.  No unusual levels of anxiety.  Concern for pregnant daughter and little kids.  D is very stressed out and so pt does not do anything socially out of fear of Covid.  On Luvox for 67 yo.  Tolerating it well.  Still doing well.  OCD mainly was checking and is managed.  No history of obs health fears.  Managing well.  Patient reports stable mood and denies depressed or irritable moods.  Patient denies any recent difficulty with anxiety.  Patient denies difficulty with sleep initiation or maintenance. Denies appetite disturbance.  Patient reports that energy and motivation have been good.  Patient denies any difficulty with concentration.  Patient denies any suicidal ideation. Plan: Luvox 150 mg daily  09/05/20 appt noted: Continues Luvox 150 mg daily for 30 years or so. No SE. OCD not bothersome.  Patient reports stable mood and denies depressed or irritable moods.  Patient denies any recent difficulty with anxiety.  Patient denies difficulty with sleep initiation or maintenance. Denies appetite disturbance.  Patient reports that energy and motivation have been good.  Patient denies any difficulty with concentration.  Patient denies any suicidal ideation. H on board with [employer].   Plan: no med changes  08/12/21 appt noted: No med changes.  Consistent with Luvox 150 mg daily. No SE Still doing well .  No sx of OCD.  No sig depression.   Patient reports stable mood and denies depressed or irritable moods.  Patient denies any recent difficulty with anxiety.  Patient denies difficulty with sleep initiation or maintenance.  Denies appetite disturbance.  Patient reports that energy and motivation have been good.  Patient denies any difficulty with concentration.  Patient denies any suicidal ideation. Health still good.  Detached early retinas repaired with miraculous Plan: Patient has a very long greater than 25-year history of OCD and depression well managed on Luvox 150 mg daily.   08/13/22 appt noted: No med changes.  Doing well.   Still on Luvox 150 mg daily.  No SE.  Youngest D married since here.  Health remains good.   No sx of OCD. Sleep ok except cats.    She drinks little caffeine. [place]    Review of Systems:  Review of Systems  HENT:  Positive for sneezing.   Eyes:  Negative for visual disturbance.  Cardiovascular:  Negative for chest pain and palpitations.  Neurological:  Negative for tremors.    Medications: I have reviewed the patient's current medications.  Current Outpatient Medications  Medication Sig Dispense Refill   levothyroxine (SYNTHROID) 50 MCG tablet Take 1 tablet (50 mcg total) by mouth daily before breakfast. 90 tablet 3   Multiple Vitamin (MULTIVITAMIN) tablet Take 1 tablet by mouth daily.     Psyllium (METAMUCIL PO) Take by mouth.     VITAMIN D PO Take 1,000 Int'l Units by mouth daily.     fluvoxaMINE (LUVOX) 50 MG tablet Take 3 tablets (150 mg total) by mouth at bedtime. 270 tablet 3   No current facility-administered  medications for this visit.    Medication Side Effects: None  No sleepiness anyhmore. Allergies: No Known Allergies  Past Medical History:  Diagnosis Date   Abnormal Pap smear of cervix    age 67   Allergy    seasonal   Anemia    Anxiety    Arthritis    Breast mass 07/06/2016   Right breast lump felt by DR, benign   Cataract    Depression    Heart murmur    History of COVID-19 10/25/2020   MVP (mitral valve prolapse)    Thyroid disease    hypothyroidism    Family History  Problem Relation Age of Onset    Hypertension Mother    Breast cancer Mother    Diabetes Father    Colon cancer Neg Hx    Stomach cancer Neg Hx    Esophageal cancer Neg Hx    Colon polyps Neg Hx     Social History   Socioeconomic History   Marital status: Married    Spouse name: Not on file   Number of children: Not on file   Years of education: Not on file   Highest education level: Not on file  Occupational History   Not on file  Tobacco Use   Smoking status: Never   Smokeless tobacco: Never  Vaping Use   Vaping status: Never Used  Substance and Sexual Activity   Alcohol use: Yes    Alcohol/week: 7.0 standard drinks of alcohol    Types: 7 Glasses of wine per week   Drug use: No   Sexual activity: Not Currently    Partners: Male    Birth control/protection: Post-menopausal  Other Topics Concern   Not on file  Social History Narrative   Not on file   Social Determinants of Health   Financial Resource Strain: Not on file  Food Insecurity: Not on file  Transportation Needs: Not on file  Physical Activity: Not on file  Stress: Not on file  Social Connections: Not on file  Intimate Partner Violence: Not on file    Past Medical History, Surgical history, Social history, and Family history were reviewed and updated as appropriate.   Please see review of systems for further details on the patient's review from today.   Objective:   Physical Exam:  LMP 06/12/2012   Physical Exam Neurological:     Mental Status: She is alert and oriented to person, place, and time.     Cranial Nerves: No dysarthria.  Psychiatric:        Attention and Perception: Attention normal.        Mood and Affect: Mood is not anxious or depressed.        Speech: Speech normal.        Behavior: Behavior is cooperative.        Thought Content: Thought content is not paranoid or delusional. Thought content does not include homicidal or suicidal ideation. Thought content does not include suicidal plan.        Cognition and  Memory: Cognition and memory normal.        Judgment: Judgment normal.     Comments: OCD managed without sx     Lab Review:  No results found for: "NA", "K", "CL", "CO2", "GLUCOSE", "BUN", "CREATININE", "CALCIUM", "PROT", "ALBUMIN", "AST", "ALT", "ALKPHOS", "BILITOT", "GFRNONAA", "GFRAA"     Component Value Date/Time   HGB 12.9 06/28/2014 1324    No results found for: "POCLITH", "LITHIUM"   No results  found for: "PHENYTOIN", "PHENOBARB", "VALPROATE", "CBMZ"   .res Assessment: Plan:    Mixed obsessional thoughts and acts - Plan: fluvoxaMINE (LUVOX) 50 MG tablet  Depression, major, recurrent, in remission (HCC) - Plan: fluvoxaMINE (LUVOX) 50 MG tablet  Patient has a very long greater than 25-year history of OCD and depression well managed on Luvox 150 mg daily.    She tolerates it well now.  History of sleepiness from it has resolved.  Symptoms have remained in remission and she is followed just on a yearly basis.  She is not having any unusual side effects.  She is aware of serotonin withdrawal.  No change indicated.    Disc DDI with caffeine.  Follow-up 1 year  Meredith Staggers MD, DFAPA  Please see After Visit Summary for patient specific instructions.  Future Appointments  Date Time Provider Department Center  11/16/2022  2:30 PM Ardell Isaacs, Forrestine Him, MD [facility] None    No orders of the defined types were placed in this encounter.     -------------------------------

## 2022-08-19 DIAGNOSIS — Z86010 Personal history of colonic polyps: Secondary | ICD-10-CM | POA: Diagnosis not present

## 2022-08-20 DIAGNOSIS — H10503 Unspecified blepharoconjunctivitis, bilateral: Secondary | ICD-10-CM | POA: Diagnosis not present

## 2022-08-28 ENCOUNTER — Encounter: Payer: Self-pay | Admitting: Gastroenterology

## 2022-09-03 DIAGNOSIS — H10503 Unspecified blepharoconjunctivitis, bilateral: Secondary | ICD-10-CM | POA: Diagnosis not present

## 2022-09-03 DIAGNOSIS — H26493 Other secondary cataract, bilateral: Secondary | ICD-10-CM | POA: Diagnosis not present

## 2022-09-21 ENCOUNTER — Ambulatory Visit (AMBULATORY_SURGERY_CENTER): Payer: BC Managed Care – PPO

## 2022-09-21 VITALS — Ht 62.0 in | Wt 130.0 lb

## 2022-09-21 DIAGNOSIS — Z86010 Personal history of colonic polyps: Secondary | ICD-10-CM

## 2022-09-21 MED ORDER — NA SULFATE-K SULFATE-MG SULF 17.5-3.13-1.6 GM/177ML PO SOLN: 1.0000 | Freq: Once | ORAL | 0 refills | Status: AC

## 2022-09-21 NOTE — Progress Notes (Signed)

## 2022-10-07 ENCOUNTER — Encounter: Payer: Self-pay | Admitting: Gastroenterology

## 2022-10-13 ENCOUNTER — Encounter: Payer: Self-pay | Admitting: Gastroenterology

## 2022-10-13 ENCOUNTER — Ambulatory Visit (AMBULATORY_SURGERY_CENTER): Payer: BC Managed Care – PPO | Admitting: Gastroenterology

## 2022-10-13 VITALS — BP 116/66 | HR 69 | Temp 97.8°F | Resp 12 | Ht 62.0 in | Wt 130.0 lb

## 2022-10-13 DIAGNOSIS — D124 Benign neoplasm of descending colon: Secondary | ICD-10-CM

## 2022-10-13 DIAGNOSIS — Z09 Encounter for follow-up examination after completed treatment for conditions other than malignant neoplasm: Secondary | ICD-10-CM

## 2022-10-13 DIAGNOSIS — Z860101 Personal history of adenomatous and serrated colon polyps: Secondary | ICD-10-CM | POA: Diagnosis not present

## 2022-10-13 DIAGNOSIS — D125 Benign neoplasm of sigmoid colon: Secondary | ICD-10-CM | POA: Diagnosis not present

## 2022-10-13 DIAGNOSIS — Z1211 Encounter for screening for malignant neoplasm of colon: Secondary | ICD-10-CM | POA: Diagnosis not present

## 2022-10-13 MED ORDER — SODIUM CHLORIDE 0.9 % IV SOLN: 500.0000 mL | Freq: Once | INTRAVENOUS | Status: DC

## 2022-10-13 NOTE — Patient Instructions (Signed)
    Handouts on polyps given to you today.   Await pathology results on polyps removed   Resume usual diet & medications     YOU HAD AN ENDOSCOPIC PROCEDURE TODAY AT THE [facility]:   Refer to the procedure report that was given to you for any specific questions about what was found during the examination.  If the procedure report does not answer your questions, please call your gastroenterologist to clarify.  If you requested that your care partner not be given the details of your procedure findings, then the procedure report has been included in a sealed envelope for you to review at your convenience later.  YOU SHOULD EXPECT: Some feelings of bloating in the abdomen. Passage of more gas than usual.  Walking can help get rid of the air that was put into your GI tract during the procedure and reduce the bloating. If you had a lower endoscopy (such as a colonoscopy or flexible sigmoidoscopy) you may notice spotting of blood in your stool or on the toilet paper. If you underwent a bowel prep for your procedure, you may not have a normal bowel movement for a few days.  Please Note:  You might notice some irritation and congestion in your nose or some drainage.  This is from the oxygen used during your procedure.  There is no need for concern and it should clear up in a day or so.  SYMPTOMS TO REPORT IMMEDIATELY:  Following lower endoscopy (colonoscopy or flexible sigmoidoscopy):  Excessive amounts of blood in the stool  Significant tenderness or worsening of abdominal pains  Swelling of the abdomen that is new, acute  Fever of 100F or higher    For urgent or emergent issues, a gastroenterologist can be reached at any hour by calling [phone]. Do not use MyChart messaging for urgent concerns.    DIET:  We do recommend a small meal at first, but then you may proceed to your regular diet.  Drink plenty of fluids but you should avoid alcoholic beverages for 24  hours.  ACTIVITY:  You should plan to take it easy for the rest of today and you should NOT DRIVE or use heavy machinery until tomorrow (because of the sedation medicines used during the test).    FOLLOW UP: Our staff will call the number listed on your records the next business day following your procedure.  We will call around 7:15- 8:00 am to check on you and address any questions or concerns that you may have regarding the information given to you following your procedure. If we do not reach you, we will leave a message.     If any biopsies were taken you will be contacted by phone or by letter within the next 1-3 weeks.  Please call us at [phone] if you have not heard about the biopsies in 3 weeks.    SIGNATURES/CONFIDENTIALITY: You and/or your care partner have signed paperwork which will be entered into your electronic medical record.  These signatures attest to the fact that that the information above on your After Visit Summary has been reviewed and is understood.  Full responsibility of the confidentiality of this discharge information lies with you and/or your care-partner.

## 2022-10-13 NOTE — Progress Notes (Signed)
Called to room to assist during endoscopic procedure.  Patient ID and intended procedure confirmed with present staff. Received instructions for my participation in the procedure from the performing physician.  

## 2022-10-13 NOTE — Progress Notes (Signed)
History & Physical  Primary Care Physician:  Delma Officer, PA Primary Gastroenterologist: Claudette Head, MD  Impression / Plan:  Personal history of adenomatous colon polyps for surveillance colonoscopy.  CHIEF COMPLAINT:  Personal history of colon polyps   HPI: Nicole Singleton is a 67 y.o. female with a personal history of adenomatous colon polyps for surveillance colonoscopy.    Past Medical History:  Diagnosis Date   Abnormal Pap smear of cervix    age 75   Allergy    seasonal   Anemia    Anxiety    Arthritis    Breast mass 07/06/2016   Right breast lump felt by DR, benign   Cataract    Depression    Heart murmur    History of COVID-19 10/25/2020   MVP (mitral valve prolapse)    Thyroid disease    hypothyroidism    Past Surgical History:  Procedure Laterality Date   CESAREAN SECTION     COLONOSCOPY     COLPOSCOPY     [name] maybe age 66    Prior to Admission medications   Medication Sig Start Date End Date Taking? Authorizing Provider  Cholecalciferol (VITAMIN D3) POWD Take 4,000 Units by mouth daily. 10/07/17   [provider]  fexofenadine (ALLEGRA ALLERGY) 180 MG tablet Take 180 mg by mouth daily as needed.    [provider]  fluvoxaMINE (LUVOX) 50 MG tablet Take 3 tablets (150 mg total) by mouth at bedtime. 08/13/22   Cottle, Steva Ready., MD  levothyroxine (SYNTHROID) 50 MCG tablet Take 1 tablet (50 mcg total) by mouth daily before breakfast. 11/11/21   Patton Salles, MD  Multiple Vitamin (MULTIVITAMIN) tablet Take 1 tablet by mouth daily.    [provider]  Psyllium (METAMUCIL PO) Take by mouth daily.    [provider]  VITAMIN D PO Take 1,000 Int'l Units by mouth daily.    [provider]    Current Outpatient Medications  Medication Sig Dispense Refill   Cholecalciferol (VITAMIN D3) POWD Take 4,000 Units by mouth daily.     fexofenadine (ALLEGRA ALLERGY) 180 MG tablet Take 180 mg by  mouth daily as needed.     fluvoxaMINE (LUVOX) 50 MG tablet Take 3 tablets (150 mg total) by mouth at bedtime. 270 tablet 3   levothyroxine (SYNTHROID) 50 MCG tablet Take 1 tablet (50 mcg total) by mouth daily before breakfast. 90 tablet 3   Multiple Vitamin (MULTIVITAMIN) tablet Take 1 tablet by mouth daily.     Psyllium (METAMUCIL PO) Take by mouth daily.     VITAMIN D PO Take 1,000 Int'l Units by mouth daily.     Current Facility-Administered Medications  Medication Dose Route Frequency Provider Last Rate Last Admin   0.9 %  sodium chloride infusion  500 mL Intravenous Once Meryl Dare, MD        Allergies as of 10/13/2022   (No Known Allergies)    Family History  Problem Relation Age of Onset   Hypertension Mother    Breast cancer Mother    Diabetes Father    Colon cancer Neg Hx    Stomach cancer Neg Hx    Esophageal cancer Neg Hx    Colon polyps Neg Hx     Social History   Socioeconomic History   Marital status: Married    Spouse name: Not on file   Number of children: Not on file   Years of education: Not  on file   Highest education level: Not on file  Occupational History   Not on file  Tobacco Use   Smoking status: Never   Smokeless tobacco: Never  Vaping Use   Vaping status: Never Used  Substance and Sexual Activity   Alcohol use: Yes    Alcohol/week: 7.0 standard drinks of alcohol    Types: 7 Glasses of wine per week   Drug use: No   Sexual activity: Not Currently    Partners: Male    Birth control/protection: Post-menopausal  Other Topics Concern   Not on file  Social History Narrative   Not on file   Social Determinants of Health   Financial Resource Strain: Not on file  Food Insecurity: Not on file  Transportation Needs: Not on file  Physical Activity: Not on file  Stress: Not on file  Social Connections: Not on file  Intimate Partner Violence: Not on file    Review of Systems:  All systems reviewed were negative except where noted  in HPI.   Physical Exam:  General:  Alert, well-developed, in NAD Head:  Normocephalic and atraumatic. Eyes:  Sclera clear, no icterus.   Conjunctiva pink. Ears:  Normal auditory acuity. Mouth:  No deformity or lesions.  Neck:  Supple; no masses. Lungs:  Clear throughout to auscultation.   No wheezes, crackles, or rhonchi.  Heart:  Regular rate and rhythm; no murmurs. Abdomen:  Soft, nondistended, nontender. No masses, hepatomegaly. No palpable masses.  Normal bowel sounds.    Rectal:  Deferred   Msk:  Symmetrical without gross deformities. Extremities:  Without edema. Neurologic:  Alert and  oriented x 4; grossly normal neurologically. Skin:  Intact without significant lesions or rashes. Psych:  Alert and cooperative. Normal mood and affect.   Venita Lick. Russella Dar  10/13/2022, 10:52 AM See Loretha Stapler, [facility], to contact our on call provider

## 2022-10-13 NOTE — Progress Notes (Signed)
Vss nad trans to pacu 

## 2022-10-13 NOTE — Op Note (Signed)
[facility] Patient Name: Nicole Singleton Procedure Date: 10/13/2022 11:02 AM MRN: [id number] Endoscopist: Meryl Dare , MD, [phone] Age: 67 Referring MD:  Date of Birth: [birth year] Gender: Female Account #: [id number] Procedure:                Colonoscopy Indications:              Surveillance: Personal history of adenomatous                            polyps on last colonoscopy 3 years ago Medicines:                Monitored Anesthesia Care Procedure:                Pre-Anesthesia Assessment:                           - Prior to the procedure, a History and Physical                            was performed, and patient medications and                            allergies were reviewed. The patient's tolerance of                            previous anesthesia was also reviewed. The risks                            and benefits of the procedure and the sedation                            options and risks were discussed with the patient.                            All questions were answered, and informed consent                            was obtained. Prior Anticoagulants: The patient has                            taken no anticoagulant or antiplatelet agents. ASA                            Grade Assessment: II - A patient with mild systemic                            disease. After reviewing the risks and benefits,                            the patient was deemed in satisfactory condition to                            undergo the procedure.  After obtaining informed consent, the colonoscope                            was passed under direct vision. Throughout the                            procedure, the patient's blood pressure, pulse, and                            oxygen saturations were monitored continuously. The                            Olympus CF-HQ190L [id number]) Colonoscope was                            introduced through the  anus and advanced to the the                            cecum, identified by appendiceal orifice and                            ileocecal valve. The ileocecal valve, appendiceal                            orifice, and rectum were photographed. The quality                            of the bowel preparation was excellent. The                            colonoscopy was performed without difficulty. The                            patient tolerated the procedure well. Scope In: 11:05:38 AM Scope Out: 11:19:39 AM Scope Withdrawal Time: 0 hours 11 minutes 13 seconds  Total Procedure Duration: 0 hours 14 minutes 1 second  Findings:                 The perianal and digital rectal examinations were                            normal.                           Two sessile polyps were found in the sigmoid colon                            and descending colon. The polyps were 5 to 7 mm in                            size. These polyps were removed with a cold snare.                            Resection and retrieval were complete.  A single medium-sized localized angioectasia                            without bleeding was found in the ascending colon.                           The exam was otherwise without abnormality on                            direct and retroflexion views. Complications:            No immediate complications. Estimated blood loss:                            None. Estimated Blood Loss:     Estimated blood loss: none. Impression:               - Two 5 to 7 mm polyps in the sigmoid colon and in                            the descending colon, removed with a cold snare.                            Resected and retrieved.                           - One nonbleeding angioectasia in the ascending                            colon.                           - The examination was otherwise normal on direct                            and retroflexion  views. Recommendation:           - Repeat colonoscopy after studies are complete for                            surveillance based on pathology results.                           - Patient has a contact number available for                            emergencies. The signs and symptoms of potential                            delayed complications were discussed with the                            patient. Return to normal activities tomorrow.                            Written discharge instructions were provided to the  patient.                           - Resume previous diet.                           - Continue present medications.                           - Await pathology results. Meryl Dare, MD 10/13/2022 11:25:22 AM This report has been signed electronically.

## 2022-10-14 ENCOUNTER — Telehealth: Payer: Self-pay | Admitting: *Deleted

## 2022-10-14 NOTE — Telephone Encounter (Signed)
  Follow up Call-     10/13/2022   10:47 AM  Call back number  Post procedure Call Back phone  # [phone]  Permission to leave phone message Yes     Patient questions:   Message left to call us if necessary.

## 2022-10-15 LAB — SURGICAL PATHOLOGY

## 2022-10-22 DIAGNOSIS — Z23 Encounter for immunization: Secondary | ICD-10-CM | POA: Diagnosis not present

## 2022-10-22 DIAGNOSIS — E039 Hypothyroidism, unspecified: Secondary | ICD-10-CM | POA: Diagnosis not present

## 2022-10-22 DIAGNOSIS — E785 Hyperlipidemia, unspecified: Secondary | ICD-10-CM | POA: Diagnosis not present

## 2022-10-22 DIAGNOSIS — Z0000 Encounter for general adult medical examination without abnormal findings: Secondary | ICD-10-CM | POA: Diagnosis not present

## 2022-10-27 ENCOUNTER — Other Ambulatory Visit: Payer: Self-pay | Admitting: Internal Medicine

## 2022-10-27 DIAGNOSIS — Z1231 Encounter for screening mammogram for malignant neoplasm of breast: Secondary | ICD-10-CM

## 2022-10-29 ENCOUNTER — Encounter: Payer: Self-pay | Admitting: Gastroenterology

## 2022-11-02 NOTE — Progress Notes (Signed)
67 y.o. G72P2003 Married Caucasian female here for annual exam.    She is followed for hypothyroidism.  Her PCP from [facility] is doing her TFTs, and I am prescribing her Synthroid 50 mcg daily.  She feels well of this dosage.   TSH 1.36 on 10/22/22 through PCP.   PCP: Delma Officer, PA   Patient's last menstrual period was 06/12/2012.           Sexually active: No.  The current method of family planning is post menopausal status.    Exercising: Yes.     Biking, walking Smoker:  no  OB History  Gravida Para Term Preterm AB Living  2 2 2     3   SAB IAB Ectopic Multiple Live Births        1 3    # Outcome Date GA Lbr Len/2nd Weight Sex Type Anes PTL Lv  2 Term 05/1990    F Vag-Spont   LIV  1A Term 12/1986    F CS-Classical   LIV  1B Term 12/1986    F CS-Classical   LIV     Health Maintenance: Pap:  07-09-17 Neg:Neg HR HPV, 07-03-15 Neg:Neg HR HPV, 06-23-13 Neg  History of abnormal Pap:  no MMG: scheduled 11/18/22, 11/10/21 Breast Density Cat C, BI-RADS CAT 1 neg Colonoscopy:   HM Colonoscopy          Colonoscopy (Every 7 Years) Next due on 10/12/2029    10/13/2022  COLONOSCOPY   Only the first 1 history entries have been loaded, but more history exists.           BMD:  she will do through through her PCP next year.  HIV: n/a Hep C: n/a  Immunization History  Administered Date(s) Administered   Influenza,inj,Quad PF,6+ Mos 11/10/2016, 11/25/2017   Tdap 04/13/2015  Took flu and Shingrix vaccine #1.     reports that she has never smoked. She has never used smokeless tobacco. She reports current alcohol use of about 7.0 standard drinks of alcohol per week. She reports that she does not use drugs.  Past Medical History:  Diagnosis Date   Abnormal Pap smear of cervix    age 67   Allergy    seasonal   Anemia    Anxiety    Arthritis    Breast mass 07/06/2016   Right breast lump felt by DR, benign   Cataract    Depression    Heart murmur    History of  COVID-19 10/25/2020   MVP (mitral valve prolapse)    Thyroid disease    hypothyroidism    Past Surgical History:  Procedure Laterality Date   CESAREAN SECTION     COLONOSCOPY     COLPOSCOPY     [name] maybe age 67    Current Outpatient Medications  Medication Sig Dispense Refill   fluvoxaMINE (LUVOX) 50 MG tablet Take 3 tablets (150 mg total) by mouth at bedtime. 270 tablet 3   levothyroxine (SYNTHROID) 50 MCG tablet TAKE 1 TABLET(50 MCG) BY MOUTH DAILY BEFORE BREAKFAST 30 tablet 0   Multiple Vitamin (MULTIVITAMIN) tablet Take 1 tablet by mouth daily.     Psyllium (METAMUCIL PO) Take by mouth daily.     VITAMIN D PO Take 1,000 Int'l Units by mouth daily.     No current facility-administered medications for this visit.    Family History  Problem Relation Age of Onset   Hypertension Mother    Breast cancer Mother  Diabetes Father    Colon cancer Neg Hx    Stomach cancer Neg Hx    Esophageal cancer Neg Hx    Colon polyps Neg Hx     Review of Systems  All other systems reviewed and are negative.   Exam:   BP (!) 118/58 (BP Location: Right Arm, Patient Position: Sitting, Cuff Size: Normal)   Pulse (!) 59   Ht 5' 2.5" (1.588 m)   Wt 127 lb (57.6 kg)   LMP 06/12/2012   SpO2 99%   BMI 22.86 kg/m     General appearance: alert, cooperative and appears stated age Head: normocephalic, without obvious abnormality, atraumatic Neck: no adenopathy, supple, symmetrical, trachea midline and thyroid normal to inspection and palpation Lungs: clear to auscultation bilaterally Breasts: normal appearance, no masses or tenderness, bilateral nipple inversion, No nipple discharge or bleeding, No axillary adenopathy Heart: regular rate and rhythm Abdomen: soft, non-tender; no masses, no organomegaly Extremities: extremities normal, atraumatic, no cyanosis or edema Skin: skin color, texture, turgor normal. No rashes or lesions Lymph nodes: cervical, supraclavicular, and axillary nodes  normal. Neurologic: grossly normal  Pelvic: External genitalia:  no lesions              No abnormal inguinal nodes palpated.              Urethra:  normal appearing urethra with no masses, tenderness or lesions              Bartholins and Skenes: normal                 Vagina: normal appearing vagina with normal color and discharge, no lesions              Cervix:  atrophy changed noted.               Pap taken: Yes.   Bimanual Exam:  Uterus:  normal size, contour, position, consistency, mobility, non-tender              Adnexa: no mass, fullness, tenderness              Rectal exam: Yes.  .  Confirms.              Anus:  normal sphincter tone, no lesions  Chaperone was present for exam:  Warren Lacy, CMA   Assessment:  Well woman with GYN exam.  Cervical cancer screening.  Vaginal atrophy.  Hypothyroidism. FH breast cancer in mother.  BRCA status not known. Bilateral nipple inversion, long standing.  Remote history of abnormal pap smear.   Plan: Pap and HR HPV collected.  We discussed tx options for atrophy:  lubricants, cooking oils, local vaginal estrogens.  Mammogram screening discussed. Self breast awareness reviewed. Guidelines for Calcium, Vitamin D, regular exercise program including cardiovascular and weight bearing exercise. Refill of Synthroid 50 mcg daily.  #90, RF 3.  She will request that her PCP take over her Synthroid Rx.   Labs with PCP. Return in one year for breast and pelvic exam.

## 2022-11-10 ENCOUNTER — Other Ambulatory Visit: Payer: Self-pay | Admitting: Obstetrics and Gynecology

## 2022-11-10 DIAGNOSIS — E039 Hypothyroidism, unspecified: Secondary | ICD-10-CM

## 2022-11-10 NOTE — Telephone Encounter (Signed)
Medication refill request: Levothyroxine  Last AEX:  11/11/21 Next AEX: 11/16/22 Last MMG (if hormonal medication request): 11/10/21 Bi-rads 1 neg  Refill authorized: #30 with 0 rf

## 2022-11-16 ENCOUNTER — Encounter: Payer: Self-pay | Admitting: Obstetrics and Gynecology

## 2022-11-16 ENCOUNTER — Ambulatory Visit (INDEPENDENT_AMBULATORY_CARE_PROVIDER_SITE_OTHER): Payer: BC Managed Care – PPO | Admitting: Obstetrics and Gynecology

## 2022-11-16 ENCOUNTER — Other Ambulatory Visit (HOSPITAL_COMMUNITY)
Admission: RE | Admit: 2022-11-16 | Discharge: 2022-11-16 | Disposition: A | Discharge status: Home / Self Care | Payer: BC Managed Care – PPO | Source: Ambulatory Visit | Attending: Obstetrics and Gynecology | Admitting: Obstetrics and Gynecology

## 2022-11-16 VITALS — BP 118/58 | HR 59 | Ht 62.5 in | Wt 127.0 lb

## 2022-11-16 DIAGNOSIS — Z124 Encounter for screening for malignant neoplasm of cervix: Secondary | ICD-10-CM | POA: Insufficient documentation

## 2022-11-16 DIAGNOSIS — E039 Hypothyroidism, unspecified: Secondary | ICD-10-CM

## 2022-11-16 DIAGNOSIS — Z01419 Encounter for gynecological examination (general) (routine) without abnormal findings: Secondary | ICD-10-CM

## 2022-11-16 MED ORDER — LEVOTHYROXINE SODIUM 50 MCG PO TABS: 50.0000 ug | ORAL_TABLET | Freq: Every day | ORAL | 3 refills | Status: DC

## 2022-11-16 NOTE — Patient Instructions (Signed)

## 2022-11-18 ENCOUNTER — Ambulatory Visit
Admission: RE | Admit: 2022-11-18 | Discharge: 2022-11-18 | Disposition: A | Discharge status: Home / Self Care | Payer: BC Managed Care – PPO | Source: Ambulatory Visit | Attending: Internal Medicine | Admitting: Internal Medicine

## 2022-11-18 DIAGNOSIS — Z1231 Encounter for screening mammogram for malignant neoplasm of breast: Secondary | ICD-10-CM | POA: Diagnosis not present

## 2022-11-19 LAB — CYTOLOGY - PAP
Comment: NEGATIVE
Diagnosis: NEGATIVE
High risk HPV: NEGATIVE

## 2022-11-24 DIAGNOSIS — L570 Actinic keratosis: Secondary | ICD-10-CM | POA: Diagnosis not present

## 2022-11-24 DIAGNOSIS — L820 Inflamed seborrheic keratosis: Secondary | ICD-10-CM | POA: Diagnosis not present

## 2022-11-24 DIAGNOSIS — D2261 Melanocytic nevi of right upper limb, including shoulder: Secondary | ICD-10-CM | POA: Diagnosis not present

## 2022-11-24 DIAGNOSIS — D225 Melanocytic nevi of trunk: Secondary | ICD-10-CM | POA: Diagnosis not present

## 2022-11-24 DIAGNOSIS — D2372 Other benign neoplasm of skin of left lower limb, including hip: Secondary | ICD-10-CM | POA: Diagnosis not present

## 2022-11-24 DIAGNOSIS — R233 Spontaneous ecchymoses: Secondary | ICD-10-CM | POA: Diagnosis not present

## 2022-12-09 DIAGNOSIS — T7840XA Allergy, unspecified, initial encounter: Secondary | ICD-10-CM | POA: Diagnosis not present

## 2023-01-07 DIAGNOSIS — L089 Local infection of the skin and subcutaneous tissue, unspecified: Secondary | ICD-10-CM | POA: Diagnosis not present

## 2023-01-07 DIAGNOSIS — M79644 Pain in right finger(s): Secondary | ICD-10-CM | POA: Diagnosis not present

## 2023-01-22 DIAGNOSIS — L570 Actinic keratosis: Secondary | ICD-10-CM | POA: Diagnosis not present

## 2023-07-22 ENCOUNTER — Telehealth: Payer: Self-pay | Admitting: Psychiatry

## 2023-07-22 NOTE — Telephone Encounter (Signed)
 Pt said pharmacy told her that her ins wont pay for her Luvox  dosage they are saying its wrong. Pharmacy told her to let us  know. Pt is taking 3x daily    Pt needs meds sent to [facility] on [address] in [place]   Next appt 8/4

## 2023-07-23 NOTE — Telephone Encounter (Signed)
 Pt picked up her Fluvoxamine  today nothing needed

## 2023-07-23 NOTE — Telephone Encounter (Signed)
 Tried reaching pharmacy several times but put on hold. Will try to submit a PA if that would help

## 2023-08-16 ENCOUNTER — Ambulatory Visit (INDEPENDENT_AMBULATORY_CARE_PROVIDER_SITE_OTHER): Payer: BC Managed Care – PPO | Admitting: Psychiatry

## 2023-08-16 ENCOUNTER — Encounter: Payer: Self-pay | Admitting: Psychiatry

## 2023-08-16 DIAGNOSIS — F422 Mixed obsessional thoughts and acts: Secondary | ICD-10-CM

## 2023-08-16 DIAGNOSIS — F3340 Major depressive disorder, recurrent, in remission, unspecified: Secondary | ICD-10-CM

## 2023-08-16 MED ORDER — FLUVOXAMINE MALEATE 50 MG PO TABS: 150.0000 mg | ORAL_TABLET | Freq: Every day | ORAL | 3 refills | Status: AC

## 2023-08-16 NOTE — Progress Notes (Signed)
 Nicole Singleton [id number] Feb 18, 1955 68 y.o.    Subjective:   Patient ID:  Nicole Singleton is a 68 y.o. (DOB 1955/04/22) female.  Chief Complaint:  Chief Complaint  Patient presents with   Follow-up    HPI FARZANA KOCI FU OCD and depression.   seen 05/2018.  No meds changed.  Remains on 150 mg of Luvox .  08/2019 appt noted: Covid free.  Vaccinated.  No unusual levels of anxiety.  Concern for pregnant daughter and little kids.  D is very stressed out and so pt does not do anything socially out of fear of Covid.  On Luvox  for 68 yo.  Tolerating it well.  Still doing well.  OCD mainly was checking and is managed.  No history of obs health fears.  Managing well.  Patient reports stable mood and denies depressed or irritable moods.  Patient denies any recent difficulty with anxiety.  Patient denies difficulty with sleep initiation or maintenance. Denies appetite disturbance.  Patient reports that energy and motivation have been good.  Patient denies any difficulty with concentration.  Patient denies any suicidal ideation. Plan: Luvox  150 mg daily  09/05/20 appt noted: Continues Luvox  150 mg daily for 30 years or so. No SE. OCD not bothersome.  Patient reports stable mood and denies depressed or irritable moods.  Patient denies any recent difficulty with anxiety.  Patient denies difficulty with sleep initiation or maintenance. Denies appetite disturbance.  Patient reports that energy and motivation have been good.  Patient denies any difficulty with concentration.  Patient denies any suicidal ideation. H on board with [employer].   Plan: no med changes  08/12/21 appt noted: No med changes.  Consistent with Luvox  150 mg daily. No SE Still doing well .  No sx of OCD.  No sig depression.   Patient reports stable mood and denies depressed or irritable moods.  Patient denies any recent difficulty with anxiety.  Patient denies difficulty with sleep initiation or maintenance. Denies appetite  disturbance.  Patient reports that energy and motivation have been good.  Patient denies any difficulty with concentration.  Patient denies any suicidal ideation. Health still good.  Detached early retinas repaired with [facility] Plan: Patient has a very long greater than 25-year history of OCD and depression well managed on Luvox  150 mg daily.   08/13/22 appt noted: No med changes.  Doing well.   Still on Luvox  150 mg daily.  No SE.  Youngest D married since here.  Health remains good.   No sx of OCD. Sleep ok except cats.  Plan no changes  08/16/23 appt noted:  Med:  Luvox  150 mg daily.  No SE.  Fine despite not a good year.  Empowering.   3 nephews and one with Lymphoma, another in hosp for TRD and failed ECT.  So sister a hard year.   OCD controlled.    She drinks little caffeine. [place]    Review of Systems:  Review of Systems  Eyes:  Negative for visual disturbance.  Cardiovascular:  Negative for chest pain and palpitations.  Neurological:  Negative for tremors and weakness.    Medications: I have reviewed the patient's current medications.  Current Outpatient Medications  Medication Sig Dispense Refill   levothyroxine  (SYNTHROID ) 50 MCG tablet Take 1 tablet (50 mcg total) by mouth daily before breakfast. 90 tablet 3   Multiple Vitamin (MULTIVITAMIN) tablet Take 1 tablet by mouth daily.     Psyllium (METAMUCIL PO) Take by  mouth daily.     VITAMIN D  PO Take 1,000 Int'l Units by mouth daily.     fluvoxaMINE  (LUVOX ) 50 MG tablet Take 3 tablets (150 mg total) by mouth at bedtime. 270 tablet 3   No current facility-administered medications for this visit.    Medication Side Effects: None  No sleepiness anyhmore. Allergies: No Known Allergies  Past Medical History:  Diagnosis Date   Abnormal Pap smear of cervix    age 68   Allergy    seasonal   Anemia    Anxiety    Arthritis    Breast mass 07/06/2016   Right breast lump felt by DR, benign    Cataract    Depression    Heart murmur    History of COVID-19 10/25/2020   MVP (mitral valve prolapse)    Thyroid  disease    hypothyroidism    Family History  Problem Relation Age of Onset   Hypertension Mother    Breast cancer Mother    Diabetes Father    Colon cancer Neg Hx    Stomach cancer Neg Hx    Esophageal cancer Neg Hx    Colon polyps Neg Hx     Social History   Socioeconomic History   Marital status: Married    Spouse name: Not on file   Number of children: Not on file   Years of education: Not on file   Highest education level: Not on file  Occupational History   Not on file  Tobacco Use   Smoking status: Never   Smokeless tobacco: Never  Vaping Use   Vaping status: Never Used  Substance and Sexual Activity   Alcohol use: Yes    Alcohol/week: 7.0 standard drinks of alcohol    Types: 7 Glasses of wine per week   Drug use: No   Sexual activity: Not Currently    Partners: Male    Birth control/protection: Post-menopausal  Other Topics Concern   Not on file  Social History Narrative   Not on file   Social Drivers of Health   Financial Resource Strain: Not on file  Food Insecurity: Not on file  Transportation Needs: Not on file  Physical Activity: Not on file  Stress: Not on file  Social Connections: Not on file  Intimate Partner Violence: Not on file    Past Medical History, Surgical history, Social history, and Family history were reviewed and updated as appropriate.   Please see review of systems for further details on the patient's review from today.   Objective:   Physical Exam:  LMP 06/12/2012   Physical Exam Constitutional:      General: She is not in acute distress. Musculoskeletal:        General: No deformity.  Neurological:     Mental Status: She is alert and oriented to person, place, and time.     Cranial Nerves: No dysarthria.     Coordination: Coordination normal.  Psychiatric:        Attention and Perception:  Attention and perception normal. She does not perceive auditory or visual hallucinations.        Mood and Affect: Mood normal. Mood is not anxious or depressed.        Speech: Speech normal.        Behavior: Behavior normal. Behavior is cooperative.        Thought Content: Thought content normal. Thought content is not paranoid or delusional. Thought content does not include homicidal or suicidal ideation. Thought  content does not include suicidal plan.        Cognition and Memory: Cognition and memory normal.        Judgment: Judgment normal.     Comments: OCD managed without sx     Lab Review:  No results found for: NA, K, CL, CO2, GLUCOSE, BUN, CREATININE, CALCIUM, PROT, ALBUMIN, AST, ALT, ALKPHOS, BILITOT, GFRNONAA, GFRAA     Component Value Date/Time   HGB 12.9 06/28/2014 1324    No results found for: POCLITH, LITHIUM   No results found for: PHENYTOIN, PHENOBARB, VALPROATE, CBMZ   .res Assessment: Plan:    Mixed obsessional thoughts and acts - Plan: fluvoxaMINE  (LUVOX ) 50 MG tablet  Depression, major, recurrent, in remission (HCC) - Plan: fluvoxaMINE  (LUVOX ) 50 MG tablet  Patient has a very long greater than 25-year history of OCD and depression well managed on Luvox  150 mg daily.    She tolerates it well now.  History of sleepiness from it has resolved.  Symptoms have remained in remission and she is followed just on a yearly basis.  She is not having any unusual side effects.  She is aware of serotonin withdrawal.  No change indicated.    Disc DDI with caffeine.  Follow-up 1 year  Lorene Macintosh MD, DFAPA  Please see After Visit Summary for patient specific instructions.  Future Appointments  Date Time Provider Department Center  11/22/2023  2:30 PM Cathlyn JAYSON Cary, Bobie BRAVO, MD [facility] None    No orders of the defined types were placed in this encounter.     -------------------------------

## 2023-08-18 ENCOUNTER — Telehealth: Payer: Self-pay

## 2023-08-18 NOTE — Telephone Encounter (Signed)
 Pt picked up Fluvoxamine  50 mg tablets #90 on 7/11 using [facility], it would have been much cheaper to get a 90 day supply instead. Trying to do a PA electronically, pharmacy was confused because pt using [facility] and its too soon to fill.  Submit through [facility] [facility]

## 2023-08-19 NOTE — Telephone Encounter (Signed)
 PA approved 08/18/23-08/17/24 Fluvoxamine  50 mg #90/30 day, [employer]

## 2023-10-27 ENCOUNTER — Other Ambulatory Visit: Payer: Self-pay | Admitting: Physician Assistant

## 2023-10-27 DIAGNOSIS — Z1231 Encounter for screening mammogram for malignant neoplasm of breast: Secondary | ICD-10-CM

## 2023-11-03 LAB — HM DEXA SCAN

## 2023-11-22 ENCOUNTER — Encounter: Payer: Self-pay | Admitting: Obstetrics and Gynecology

## 2023-11-22 ENCOUNTER — Ambulatory Visit
Admission: RE | Admit: 2023-11-22 | Discharge: 2023-11-22 | Disposition: A | Discharge status: Home / Self Care | Source: Ambulatory Visit | Attending: Physician Assistant | Admitting: Physician Assistant

## 2023-11-22 ENCOUNTER — Ambulatory Visit (INDEPENDENT_AMBULATORY_CARE_PROVIDER_SITE_OTHER): Payer: BC Managed Care – PPO | Admitting: Obstetrics and Gynecology

## 2023-11-22 VITALS — BP 116/80 | HR 93 | Ht 63.5 in | Wt 129.0 lb

## 2023-11-22 DIAGNOSIS — Z9189 Other specified personal risk factors, not elsewhere classified: Secondary | ICD-10-CM

## 2023-11-22 DIAGNOSIS — E039 Hypothyroidism, unspecified: Secondary | ICD-10-CM

## 2023-11-22 DIAGNOSIS — Z5181 Encounter for therapeutic drug level monitoring: Secondary | ICD-10-CM

## 2023-11-22 DIAGNOSIS — Z01419 Encounter for gynecological examination (general) (routine) without abnormal findings: Secondary | ICD-10-CM

## 2023-11-22 DIAGNOSIS — Z1231 Encounter for screening mammogram for malignant neoplasm of breast: Secondary | ICD-10-CM

## 2023-11-22 MED ORDER — LEVOTHYROXINE SODIUM 50 MCG PO TABS: 50.0000 ug | ORAL_TABLET | Freq: Every day | ORAL | 3 refills | Status: AC

## 2023-11-22 NOTE — Patient Instructions (Signed)

## 2023-11-22 NOTE — Progress Notes (Addendum)
 68 y.o. G36P2003 Married Caucasian female here for a breast and pelvic exam.    The patient is also followed for hypothyroidism. Her PCP is following her thyroid  function.     No vaginal bleeding or discharge.  PCP: Alys Schuyler HERO, PA   Patient's last menstrual period was 06/12/2012.           Sexually active: No.  The current method of family planning is post menopausal status.    Menopausal hormone therapy:  n/a Exercising: Yes.    Hiking or biking  Smoker:  no  OB History     Gravida  2   Para  2   Term  2   Preterm      AB      Living  3      SAB      IAB      Ectopic      Multiple  1   Live Births  3           HEALTH MAINTENANCE: Last 2 paps: 11/16/22 neg HR HPV neg, 07/09/17 neg HPV neg  History of abnormal Pap or positive HPV:  no Mammogram:  11/18/22 Breast Density Cat C, BIRADS Cat 1 neg.  Had it done today.   Colonoscopy:  10/13/22 - due in 2031 Bone Density:   last week - [facility] - ordered through PCP.  Immunization History  Administered Date(s) Administered   Influenza,inj,Quad PF,6+ Mos 11/10/2016, 11/25/2017   Tdap 04/13/2015      reports that she has never smoked. She has never used smokeless tobacco. She reports current alcohol use of about 7.0 standard drinks of alcohol per week. She reports that she does not use drugs.  Past Medical History:  Diagnosis Date   Abnormal Pap smear of cervix    age 76   Allergy    seasonal   Anemia    Anxiety    Arthritis    Breast mass 07/06/2016   Right breast lump felt by DR, benign   Cataract    Depression    Heart murmur    History of COVID-19 10/25/2020   MVP (mitral valve prolapse)    Thyroid  disease    hypothyroidism    Past Surgical History:  Procedure Laterality Date   CESAREAN SECTION     COLONOSCOPY     COLPOSCOPY     [name] maybe age 58    Current Outpatient Medications  Medication Sig Dispense Refill   fluvoxaMINE  (LUVOX ) 50 MG tablet Take 3 tablets (150 mg total)  by mouth at bedtime. 270 tablet 3   Multiple Vitamin (MULTIVITAMIN) tablet Take 1 tablet by mouth daily.     Psyllium (METAMUCIL PO) Take by mouth daily.     VITAMIN D  PO Take 1,000 Int'l Units by mouth daily.     levothyroxine  (SYNTHROID ) 50 MCG tablet Take 1 tablet (50 mcg total) by mouth daily before breakfast. 90 tablet 3   No current facility-administered medications for this visit.    ALLERGIES: Patient has no known allergies.  Family History  Problem Relation Age of Onset   Hypertension Mother    Breast cancer Mother    Diabetes Father    Colon cancer Neg Hx    Stomach cancer Neg Hx    Esophageal cancer Neg Hx    Colon polyps Neg Hx     Review of Systems  All other systems reviewed and are negative.   PHYSICAL EXAM:  BP 116/80 (BP Location: Left  Arm, Patient Position: Sitting)   Pulse 93   Ht 5' 3.5 (1.613 m)   Wt 129 lb (58.5 kg)   LMP 06/12/2012   SpO2 97%   BMI 22.49 kg/m     General appearance: alert, cooperative and appears stated age Head: normocephalic, without obvious abnormality, atraumatic Neck: no adenopathy, supple, symmetrical, trachea midline and thyroid  normal to inspection and palpation Lungs: clear to auscultation bilaterally Breasts: normal appearance, no masses or tenderness, bilateral nipple inversion (long standing), No nipple discharge or bleeding, No axillary adenopathy Heart: regular rate and rhythm Abdomen: soft, non-tender; no masses, no organomegaly Extremities: extremities normal, atraumatic, no cyanosis or edema Skin: skin color, texture, turgor normal. No rashes or lesions Lymph nodes: cervical, supraclavicular, and axillary nodes normal. Neurologic: grossly normal  Pelvic: External genitalia:  no lesions              No abnormal inguinal nodes palpated.              Urethra:  normal appearing urethra with no masses, tenderness or lesions              Bartholins and Skenes: normal                 Vagina: normal appearing vagina  with normal color and discharge, no lesions              Cervix: no lesions              Pap taken: no Bimanual Exam:  Uterus:  normal size, contour, position, consistency, mobility, non-tender              Adnexa: no mass, fullness, tenderness              Rectal exam: yes.  Confirms.              Anus:  normal sphincter tone, no lesions  Chaperone was present for exam:  Kari HERO, CMA  ASSESSMENT: Encounter for breast and pelvic exam.  Other specified personal risk factors. Hypothyroidism.  Encounter for medication monitoring.  Bilateral nipple inversion, long standing.   PLAN: Mammogram screening discussed. Self breast awareness reviewed. Pap and HRV collected:  no.  Due in 2029.  Guidelines for Calcium, Vitamin D , regular exercise program including cardiovascular and weight bearing exercise. Medication refills:  Synthroid  50 mcg po daily.  #90, RF 3.   Patient will request her PCP to take over this Rx.  TSH, free T4. Get copy of bone density from [facility].  Follow up:  yearly and prn.     Additional counseling given.  yes. 20 min  total time was spent for this patient encounter, including preparation, face-to-face counseling with the patient, coordination of care, and documentation of the encounter in addition to doing the breast and pelvic exam.

## 2023-11-23 ENCOUNTER — Ambulatory Visit: Payer: Self-pay | Admitting: Obstetrics and Gynecology

## 2023-11-23 LAB — TSH: TSH: 1.02 m[IU]/L (ref 0.40–4.50)

## 2023-11-23 LAB — T4, FREE: Free T4: 1.5 ng/dL (ref 0.8–1.8)

## 2023-11-25 ENCOUNTER — Other Ambulatory Visit: Payer: Self-pay | Admitting: Physician Assistant

## 2023-11-25 DIAGNOSIS — R928 Other abnormal and inconclusive findings on diagnostic imaging of breast: Secondary | ICD-10-CM

## 2023-12-06 ENCOUNTER — Other Ambulatory Visit: Payer: Self-pay | Admitting: Physician Assistant

## 2023-12-06 ENCOUNTER — Ambulatory Visit
Admission: RE | Admit: 2023-12-06 | Discharge: 2023-12-06 | Disposition: A | Discharge status: Home / Self Care | Source: Ambulatory Visit | Attending: Physician Assistant | Admitting: Physician Assistant

## 2023-12-06 DIAGNOSIS — R928 Other abnormal and inconclusive findings on diagnostic imaging of breast: Secondary | ICD-10-CM

## 2024-06-06 ENCOUNTER — Encounter

## 2024-08-16 ENCOUNTER — Ambulatory Visit: Admitting: Psychiatry

## 2024-11-28 ENCOUNTER — Encounter: Admitting: Obstetrics and Gynecology
